# Patient Record
Sex: Male | Born: 1960 | Race: Black or African American | Hispanic: No | Marital: Married | Smoking: Former smoker
Health system: Southern US, Community
[De-identification: ages and names within clinical notes are randomized; demographics above are authoritative.]

## PROBLEM LIST (undated history)

## (undated) ENCOUNTER — Emergency Department: Discharge status: Absent without leave | Payer: Self-pay

## (undated) DIAGNOSIS — E785 Hyperlipidemia, unspecified: Secondary | ICD-10-CM

## (undated) DIAGNOSIS — I10 Essential (primary) hypertension: Secondary | ICD-10-CM

## (undated) DIAGNOSIS — K219 Gastro-esophageal reflux disease without esophagitis: Secondary | ICD-10-CM

## (undated) HISTORY — DX: Gastro-esophageal reflux disease without esophagitis: K21.9

## (undated) HISTORY — DX: Hyperlipidemia, unspecified: E78.5

## (undated) HISTORY — PX: REPLACEMENT TOTAL KNEE: SUR1224

## (undated) HISTORY — PX: VARICOCELECTOMY: SHX1084

## (undated) HISTORY — DX: Essential (primary) hypertension: I10

---

## 2001-06-14 ENCOUNTER — Emergency Department (HOSPITAL_COMMUNITY): Admission: EM | Admit: 2001-06-14 | Discharge: 2001-06-14 | Payer: Self-pay | Admitting: Emergency Medicine

## 2008-11-09 ENCOUNTER — Ambulatory Visit: Payer: Self-pay | Admitting: Internal Medicine

## 2008-11-09 DIAGNOSIS — K921 Melena: Secondary | ICD-10-CM

## 2008-11-09 DIAGNOSIS — E7800 Pure hypercholesterolemia, unspecified: Secondary | ICD-10-CM

## 2008-11-09 DIAGNOSIS — F528 Other sexual dysfunction not due to a substance or known physiological condition: Secondary | ICD-10-CM

## 2008-11-09 LAB — CONVERTED CEMR LAB
Alkaline Phosphatase: 76 units/L (ref 39–117)
Bilirubin, Direct: 0.1 mg/dL (ref 0.0–0.3)
Calcium: 9.6 mg/dL (ref 8.4–10.5)
Cholesterol: 270 mg/dL (ref 0–200)
Crystals: NEGATIVE
Eosinophils Absolute: 0 10*3/uL (ref 0.0–0.7)
Eosinophils Relative: 0.6 % (ref 0.0–5.0)
GFR calc Af Amer: 83 mL/min
GFR calc non Af Amer: 69 mL/min
Glucose, Bld: 108 mg/dL — ABNORMAL HIGH (ref 70–99)
HCT: 39.4 % (ref 39.0–52.0)
HDL: 43 mg/dL (ref >39.0)
Hemoglobin, Urine: NEGATIVE
LDL Goal: 130 mg/dL
Leukocytes, UA: NEGATIVE
MCV: 83.3 fL (ref 78.0–100.0)
Monocytes Absolute: 0.4 10*3/uL (ref 0.1–1.0)
Neutrophils Relative %: 37.6 % — ABNORMAL LOW (ref 43.0–77.0)
Nitrite: NEGATIVE
PSA: 1.06 ng/mL (ref 0.10–4.00)
Platelets: 233 10*3/uL (ref 150–400)
Potassium: 3.2 meq/L — ABNORMAL LOW (ref 3.5–5.1)
RBC / HPF: NONE SEEN
RDW: 14.3 % (ref 11.5–14.6)
Sodium: 143 meq/L (ref 135–145)
Specific Gravity, Urine: >=1.03 (ref 1.000–1.03)
TSH: 1.69 microintl units/mL (ref 0.35–5.50)
Testosterone: 245.26 ng/dL — ABNORMAL LOW (ref 350.00–890)
Total Bilirubin: 0.6 mg/dL (ref 0.3–1.2)
Total CHOL/HDL Ratio: 6.3
Total Protein: 7 g/dL (ref 6.0–8.3)
Triglycerides: 327 mg/dL (ref 0–149)
Urine Glucose: NEGATIVE mg/dL
Urobilinogen, UA: 0.2 (ref 0.0–1.0)
WBC: 5.6 10*3/uL (ref 4.5–10.5)

## 2008-11-12 ENCOUNTER — Encounter: Payer: Self-pay | Admitting: Internal Medicine

## 2008-11-12 DIAGNOSIS — I10 Essential (primary) hypertension: Secondary | ICD-10-CM

## 2008-11-12 DIAGNOSIS — E785 Hyperlipidemia, unspecified: Secondary | ICD-10-CM

## 2008-12-06 ENCOUNTER — Telehealth (INDEPENDENT_AMBULATORY_CARE_PROVIDER_SITE_OTHER): Payer: Self-pay | Admitting: *Deleted

## 2008-12-12 ENCOUNTER — Telehealth: Payer: Self-pay | Admitting: Internal Medicine

## 2008-12-19 ENCOUNTER — Ambulatory Visit: Payer: Self-pay | Admitting: Internal Medicine

## 2008-12-19 DIAGNOSIS — R7303 Prediabetes: Secondary | ICD-10-CM

## 2008-12-19 DIAGNOSIS — E876 Hypokalemia: Secondary | ICD-10-CM

## 2008-12-19 LAB — CONVERTED CEMR LAB
BUN: 11 mg/dL (ref 6–23)
Chloride: 104 meq/L (ref 96–112)
Cortisol, Plasma: 9.5 ug/dL
GFR calc Af Amer: 116 mL/min
GFR calc non Af Amer: 96 mL/min
Hgb A1c MFr Bld: 5.9 % (ref 4.6–6.0)
Potassium: 3.5 meq/L (ref 3.5–5.1)

## 2008-12-20 ENCOUNTER — Encounter: Payer: Self-pay | Admitting: Internal Medicine

## 2009-02-01 ENCOUNTER — Telehealth: Payer: Self-pay | Admitting: Internal Medicine

## 2009-06-03 ENCOUNTER — Telehealth: Payer: Self-pay | Admitting: Internal Medicine

## 2009-06-21 ENCOUNTER — Telehealth: Payer: Self-pay | Admitting: Internal Medicine

## 2009-08-20 ENCOUNTER — Telehealth: Payer: Self-pay | Admitting: Internal Medicine

## 2009-09-06 ENCOUNTER — Ambulatory Visit: Payer: Self-pay | Admitting: Internal Medicine

## 2009-11-01 ENCOUNTER — Ambulatory Visit: Payer: Self-pay | Admitting: Internal Medicine

## 2009-11-01 ENCOUNTER — Telehealth: Payer: Self-pay | Admitting: Internal Medicine

## 2009-11-01 DIAGNOSIS — K219 Gastro-esophageal reflux disease without esophagitis: Secondary | ICD-10-CM

## 2009-11-04 ENCOUNTER — Encounter: Payer: Self-pay | Admitting: Internal Medicine

## 2009-11-29 ENCOUNTER — Ambulatory Visit: Payer: Self-pay | Admitting: Gastroenterology

## 2009-12-19 ENCOUNTER — Telehealth: Payer: Self-pay | Admitting: Internal Medicine

## 2010-01-01 ENCOUNTER — Telehealth: Payer: Self-pay | Admitting: Internal Medicine

## 2010-02-04 ENCOUNTER — Telehealth: Payer: Self-pay | Admitting: Internal Medicine

## 2010-04-24 ENCOUNTER — Ambulatory Visit: Payer: Self-pay | Admitting: Internal Medicine

## 2010-06-13 ENCOUNTER — Ambulatory Visit: Payer: Self-pay | Admitting: Internal Medicine

## 2010-06-13 DIAGNOSIS — N471 Phimosis: Secondary | ICD-10-CM

## 2010-06-13 DIAGNOSIS — G4730 Sleep apnea, unspecified: Secondary | ICD-10-CM | POA: Insufficient documentation

## 2010-06-13 DIAGNOSIS — N478 Other disorders of prepuce: Secondary | ICD-10-CM

## 2010-06-19 ENCOUNTER — Encounter (INDEPENDENT_AMBULATORY_CARE_PROVIDER_SITE_OTHER): Payer: Self-pay | Admitting: *Deleted

## 2010-07-03 ENCOUNTER — Telehealth: Payer: Self-pay | Admitting: Internal Medicine

## 2010-07-08 ENCOUNTER — Encounter: Payer: Self-pay | Admitting: Internal Medicine

## 2010-07-21 ENCOUNTER — Encounter (INDEPENDENT_AMBULATORY_CARE_PROVIDER_SITE_OTHER): Payer: Self-pay | Admitting: *Deleted

## 2010-08-14 ENCOUNTER — Telehealth: Payer: Self-pay | Admitting: Internal Medicine

## 2010-09-02 ENCOUNTER — Telehealth: Payer: Self-pay | Admitting: Internal Medicine

## 2010-09-05 ENCOUNTER — Ambulatory Visit: Payer: Self-pay | Admitting: Gastroenterology

## 2010-09-05 ENCOUNTER — Encounter (INDEPENDENT_AMBULATORY_CARE_PROVIDER_SITE_OTHER): Payer: Self-pay | Admitting: *Deleted

## 2010-09-05 DIAGNOSIS — K5900 Constipation, unspecified: Secondary | ICD-10-CM | POA: Insufficient documentation

## 2010-09-05 DIAGNOSIS — K625 Hemorrhage of anus and rectum: Secondary | ICD-10-CM

## 2010-09-05 LAB — CONVERTED CEMR LAB
Alkaline Phosphatase: 90 units/L (ref 39–117)
Basophils Absolute: 0 10*3/uL (ref 0.0–0.1)
Bilirubin, Direct: 0.1 mg/dL (ref 0.0–0.3)
Calcium: 9.9 mg/dL (ref 8.4–10.5)
Ferritin: 62.8 ng/mL (ref 22.0–322.0)
Folate: 8.1 ng/mL
GFR calc non Af Amer: 89.23 mL/min (ref >60)
HCT: 38.4 % — ABNORMAL LOW (ref 39.0–52.0)
Iron: 91 ug/dL (ref 42–165)
Lymphs Abs: 3.3 10*3/uL (ref 0.7–4.0)
MCV: 82.7 fL (ref 78.0–100.0)
Monocytes Absolute: 0.5 10*3/uL (ref 0.1–1.0)
Neutrophils Relative %: 42 % — ABNORMAL LOW (ref 43.0–77.0)
Platelets: 221 10*3/uL (ref 150.0–400.0)
Potassium: 4 meq/L (ref 3.5–5.1)
RDW: 16.6 % — ABNORMAL HIGH (ref 11.5–14.6)
Saturation Ratios: 25.9 % (ref 20.0–50.0)
Sodium: 142 meq/L (ref 135–145)
Total Bilirubin: 0.3 mg/dL (ref 0.3–1.2)
Transferrin: 251 mg/dL (ref 212.0–360.0)

## 2010-10-13 ENCOUNTER — Ambulatory Visit: Payer: Self-pay | Admitting: Gastroenterology

## 2010-10-21 ENCOUNTER — Telehealth: Payer: Self-pay | Admitting: Internal Medicine

## 2010-11-17 DIAGNOSIS — K222 Esophageal obstruction: Secondary | ICD-10-CM | POA: Insufficient documentation

## 2010-11-17 DIAGNOSIS — K449 Diaphragmatic hernia without obstruction or gangrene: Secondary | ICD-10-CM | POA: Insufficient documentation

## 2010-12-26 ENCOUNTER — Telehealth: Payer: Self-pay | Admitting: Internal Medicine

## 2011-01-15 NOTE — Progress Notes (Signed)
Summary: Samples  Phone Note Call from Patient Call back at [facility] Phone [phone]   Summary of Call: Patient is requesting samples of bp med.  Initial call taken by: Lamar Sprinkles, CMA,  August 14, 2010 10:19 AM  Follow-up for Phone Call        Left message on voicemail that samples are up front and ready for pick up. Follow-up by: Lucious Groves CMA,  August 14, 2010 3:41 PM

## 2011-01-15 NOTE — Miscellaneous (Signed)
Summary: Nexium Rx  Clinical Lists Changes  Medications: Added new medication of NEXIUM 40 MG  CPDR (ESOMEPRAZOLE MAGNESIUM) 1 capsule each day 30 minutes before meal - Signed Rx of NEXIUM 40 MG  CPDR (ESOMEPRAZOLE MAGNESIUM) 1 capsule each day 30 minutes before meal;  #30 x 11;  Signed;  Entered by: Doristine Church RN II;  Authorized by: Mardella Layman MD [facility];  Method used: Electronically to [facility] [address] [phone].*, [address] [place], [place], [place]  [address], Ph: [phone], Fax: [phone] Observations: Added new observation of ALLERGY REV: Done (10/13/2010 14:59) Added new observation of NKA: T (10/13/2010 14:59)    Prescriptions: NEXIUM 40 MG  CPDR (ESOMEPRAZOLE MAGNESIUM) 1 capsule each day 30 minutes before meal  #30 x 11   Entered by:   Doristine Church RN II   Authorized by:   Mardella Layman MD [facility]   Signed by:   Doristine Church RN II on 10/13/2010   Method used:   Electronically to        [facility] [address] [phone].* (retail)       [address]       [place], [place]  [address]       Ph: [phone]       Fax: [phone]   RxID:   [id number]

## 2011-01-15 NOTE — Progress Notes (Signed)
Summary: Azor samples  Phone Note Call from Patient Call back at [phone]   Caller: Patient Call For: Etta Grandchild MD Summary of Call: Pt requesting sample of his blood pressure medicine, pt states he was given sample prior. Please let pt know if he can get. Initial call taken by: Verdell Face,  December 19, 2009 9:32 AM  Follow-up for Phone Call        Patient was no show on 12/17. Is it ok to give Azor samples? Follow-up by: Lucious Groves,  December 19, 2009 11:55 AM  Additional Follow-up for Phone Call Additional follow up Details #1::        yes Additional Follow-up by: Etta Grandchild MD,  December 19, 2009 11:58 AM    Additional Follow-up for Phone Call Additional follow up Details #2::    Left message on voicemal notifying patient samples are ready for pick up. Follow-up by: Lucious Groves,  December 19, 2009 12:00 PM  Prescriptions: AZOR 5-20 MG TABS (AMLODIPINE-OLMESARTAN) once daily for high blood pressure  #35 x 0   Entered by:   Lucious Groves   Authorized by:   Etta Grandchild MD   Signed by:   Lucious Groves on 12/19/2009   Method used:   Samples Given   RxID:   [id number]

## 2011-01-15 NOTE — Progress Notes (Signed)
Summary: CALL  Phone Note Call from Patient Call back at Home Phone [phone]   Summary of Call: Patient is requesting a call back. left mess to call office back  Initial call taken by: Lamar Sprinkles, CMA,  September 02, 2010 3:00 PM  Follow-up for Phone Call        left mess to call office back.............Marland KitchenLamar Sprinkles, CMA  September 03, 2010 5:31 PM   Additional Follow-up for Phone Call Additional follow up Details #1::        Called pt again still no ansew LMOM RTC. Signing off note 3rd attempt Additional Follow-up by: Orlan Leavens RMA,  September 04, 2010 3:13 PM

## 2011-01-15 NOTE — Assessment & Plan Note (Signed)
Summary: fu--and sleep study referral--stc   Vital Signs:  Patient profile:   50 year old male Height:      66 inches (167.64 cm) Weight:      231 pounds (105.00 kg) BMI:     37.42 O2 Sat:      97 % on Room air Temp:     98.5 degrees F (36.94 degrees C) oral Pulse rate:   72 / minute Resp:     16 per minute BP sitting:   128 / 80  (left arm) Cuff size:   regular  Vitals Entered By: Lanier Prude, CMA(AAMA) (June 13, 2010 8:37 AM)  O2 Flow:  Room air CC: f/u. 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  History of Present Illness: F/up 1. He wants to see a Insurance underwriter for circumcision 2. He has heavy snoring and wife has witnessed apnea, also DOT says that he needs a sleep study 3. He wants to reschedule colonoscopy-he had a hard BM last week and noticed a small amount of blood    on toilet paper  Hypertension History:      He denies headache, chest pain, palpitations, dyspnea with exertion, orthopnea, PND, peripheral edema, visual symptoms, neurologic problems, syncope, and side effects from treatment.  He notes no problems with any antihypertensive medication side effects.        Positive major cardiovascular risk factors include male age 51 years old or older, hyperlipidemia, and hypertension.  Negative major cardiovascular risk factors include no history of diabetes, negative family history for ischemic heart disease, and non-tobacco-user status.        Further assessment for target organ damage reveals no history of ASHD, cardiac end-organ damage (CHF/LVH), stroke/TIA, peripheral vascular disease, renal insufficiency, or hypertensive retinopathy.     Preventive Screening-Counseling & Management  Alcohol-Tobacco     Alcohol drinks/day: 0     Smoking Status: quit     Year Quit: 1989     Pack years: 15     Passive Smoke  Exposure: no  Hep-HIV-STD-Contraception     HIV Risk: no      Sexual History:  currently monogamous.        Drug Use:  never.        Blood Transfusions:  no.    Medications Prior to Update: 1)  Fish Oil 2)  Azor 5-20 Mg Tabs (Amlodipine-Olmesartan) .... Once Daily For High Blood Pressure  Current Medications (verified): 1)  Fish Oil 2)  Azor 5-20 Mg Tabs (Amlodipine-Olmesartan) .... Once Daily For High Blood Pressure  Allergies (verified): No Known Drug Allergies  Past History:  Past Medical History: Last updated: 11/01/2009 Hyperlipidemia Hypertension GERD  Past Surgical History: Last updated: 11/09/2008 Varicocele removal  Family History: Last updated: 11/09/2008 Family History Diabetes 1st degree relative Family History High cholesterol Family History Hypertension  Social History: Last updated: 06/13/2010 Occupation: driver Married Alcohol use-no Drug use-no Regular exercise-no  Risk Factors: Alcohol Use: 0 (06/13/2010) Exercise: no (11/09/2008)  Risk Factors: Smoking Status: quit (06/13/2010) Passive Smoke Exposure: no (06/13/2010)  Family History: Reviewed history from 11/09/2008 and no changes required. Family History Diabetes 1st degree relative Family History High cholesterol Family History Hypertension  Social History: Reviewed history from 11/09/2008 and no changes required. Occupation: driver Married Alcohol use-no Drug use-no Regular exercise-no Sexual History:  currently monogamous Drug Use:  never Blood Transfusions:  no  Review of Systems       The patient complains of weight gain and hematochezia.  The patient denies anorexia, fever, chest pain, syncope, dyspnea on exertion, peripheral edema, prolonged cough, headaches, hemoptysis, abdominal pain, melena, severe indigestion/heartburn, suspicious skin lesions, difficulty walking, depression, and enlarged lymph nodes.    Physical Exam  General:  alert, well-developed,  well-nourished, well-hydrated, and overweight-appearing.   Head:  normocephalic and atraumatic.   Mouth:  Oral mucosa and oropharynx without lesions or exudates.  Teeth in good repair. Neck:  supple, full ROM, and no masses.   Lungs:  Normal respiratory effort, chest expands symmetrically. Lungs are clear to auscultation, no crackles or wheezes. Heart:  Normal rate and regular rhythm. S1 and S2 normal without gallop, murmur, click, rub or other extra sounds. Abdomen:  soft, non-tender, normal bowel sounds, no distention, no hepatomegaly, and no splenomegaly.   Genitalia:  uncircumcised, no hydrocele, no varicocele, no scrotal masses, no cutaneous lesions, no urethral discharge, R testes atrophic, and L testes atrophic.   Msk:  normal ROM, no joint tenderness, no joint swelling, no joint warmth, no redness over joints, no joint deformities, no joint instability, and no crepitation.   Extremities:  No clubbing, cyanosis, edema, or deformity noted with normal full range of motion of all joints.   Neurologic:  No cranial nerve deficits noted. Station and gait are normal. Plantar reflexes are down-going bilaterally. DTRs are symmetrical throughout. Sensory, motor and coordinative functions appear intact.   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 Once daily for high blood pressure  BP today: 128/80 Prior BP: 114/82 (11/01/2009)  Prior 10 Yr Risk Heart Disease: Not enough information (09/06/2009)  Labs  Reviewed: K+: 3.5 (12/19/2008) Creat: : 0.9 (12/19/2008)   Chol: 270 (11/09/2008)   HDL: 43.0 (11/09/2008)   LDL: DEL (11/09/2008)   TG: 327 (11/09/2008)  Complete Medication List: 1)  Fish Oil  2)  Azor 5-20 Mg Tabs (Amlodipine-olmesartan) .... Once daily for high blood pressure  Hypertension Assessment/Plan:      The patient's hypertensive risk group is category B: At least one risk factor (excluding diabetes) with no target organ damage.  Today's blood pressure is 128/80.  His blood pressure goal is < 140/90.  Patient Instructions: 1)  Please schedule a follow-up appointment in 2 months for a physical with fasting labs. 2)  It is important that you exercise regularly at least 20 minutes 5 times a week. If you develop chest pain, have severe difficulty breathing, or feel very tired , stop exercising immediately and seek medical attention. 3)  You need to lose weight. Consider a lower calorie diet and regular exercise.  4)  Check your Blood Pressure regularly. If it is above 140/90: you should make an appointment.

## 2011-01-15 NOTE — Progress Notes (Signed)
Summary: Needs info faxed??  Phone Note Call from Patient Call back at Home Phone [phone]   Summary of Call: Patient is requesting that we fax "proof of an apt" to where he is having his DOT physical. Fax [phone] Initial call taken by: Lamar Sprinkles, CMA,  July 03, 2010 10:32 AM  Follow-up for Phone Call        Patient is requesting that we fax confirmation of apt w/Pulmonary. Faxed referral form that pt has an apt tomorrow. Pt informed  Follow-up by: Lamar Sprinkles, CMA,  July 03, 2010 2:45 PM

## 2011-01-15 NOTE — Progress Notes (Signed)
Summary: bp meds  Phone Note Call from Patient Call back at Home Phone [phone]   Caller: Patient Call For: Etta Grandchild MD Summary of Call: Pt needs blood pressure meds. Initial call taken by: Verdell Face,  December 26, 2010 3:33 PM  Follow-up for Phone Call        left message for pt to callback office Follow-up by: Brenton Grills CMA Duncan Dull),  December 26, 2010 4:07 PM    Prescriptions: AZOR 5-20 MG TABS (AMLODIPINE-OLMESARTAN) once daily for high blood pressure  #30 x 6   Entered by:   Rock Nephew CMA   Authorized by:   Etta Grandchild MD   Signed by:   Rock Nephew CMA on 12/26/2010   Method used:   Electronically to        [facility] [address] [phone].* (retail)       [address]       [place], [place]  [address]       Ph: [phone]       Fax: [phone]   RxID:   [id number]   Appended Document: bp meds Pt informed

## 2011-01-15 NOTE — Progress Notes (Signed)
Summary: NEW REFERRAL  Phone Note Call from Patient Call back at [phone]   Summary of Call: Pt is not happy with [facility] and he would like referral to another podiatrist.  Initial call taken by: Lamar Sprinkles, CMA,  February 04, 2010 1:09 PM

## 2011-01-15 NOTE — Letter (Signed)
Summary: [facility] Instructions  [facility]  [address] [place], [place] [address]   Phone: [phone]  Fax: [phone]       Alexander Moses    01/24/1961    MRN: [id number]        Procedure Day /Date: 10/13/2010 Monday     Arrival Time: 1:00pm     Procedure Time: 2:00pm     Location of Procedure:                    X  [facility] (4th Floor)                        PREPARATION FOR COLONOSCOPY WITH MOVIPREP   Starting 5 days prior to your procedure 10/08/2010 do not eat nuts, seeds, popcorn, corn, beans, peas,  salads, or any raw vegetables.  Do not take any fiber supplements (e.g. Metamucil, Citrucel, and Benefiber).  THE DAY BEFORE YOUR PROCEDURE         DATE: 10/12/2010 DAY: Sunday  1.  Drink clear liquids the entire day-NO SOLID FOOD  2.  Do not drink anything colored red or purple.  Avoid juices with pulp.  No orange juice.  3.  Drink at least 64 oz. (8 glasses) of fluid/clear liquids during the day to prevent dehydration and help the prep work efficiently.  CLEAR LIQUIDS INCLUDE: Water Jello Ice Popsicles Tea (sugar ok, no milk/cream) Powdered fruit flavored drinks Coffee (sugar ok, no milk/cream) Gatorade Juice: apple, white grape, white cranberry  Lemonade Clear bullion, consomm, broth Carbonated beverages (any kind) Strained chicken noodle soup Hard Candy                             4.  In the morning, mix first dose of MoviPrep solution:    Empty 1 Pouch A and 1 Pouch B into the disposable container    Add lukewarm drinking water to the top line of the container. Mix to dissolve    Refrigerate (mixed solution should be used within 24 hrs)  5.  Begin drinking the prep at 5:00 p.m. The MoviPrep container is divided by 4 marks.   Every 15 minutes drink the solution down to the next mark (approximately 8 oz) until the full liter is complete.   6.  Follow completed prep with 16 oz of clear liquid of your choice (Nothing  red or purple).  Continue to drink clear liquids until bedtime.  7.  Before going to bed, mix second dose of MoviPrep solution:    Empty 1 Pouch A and 1 Pouch B into the disposable container    Add lukewarm drinking water to the top line of the container. Mix to dissolve    Refrigerate  THE DAY OF YOUR PROCEDURE      DATE: 10/13/2010 DAY: Monday  Beginning at 9:00am (5 hours before procedure):         1. Every 15 minutes, drink the solution down to the next mark (approx 8 oz) until the full liter is complete.  2. Follow completed prep with 16 oz. of clear liquid of your choice.    3. You may drink clear liquids until 12:00pm (2 HOURS BEFORE PROCEDURE).   MEDICATION INSTRUCTIONS  Unless otherwise instructed, you should take regular prescription medications with a small sip of water   as early as possible the morning of your procedure.  OTHER INSTRUCTIONS  You will need a responsible adult at least 51 years of age to accompany you and drive you home.   This person must remain in the waiting room during your procedure.  Wear loose fitting clothing that is easily removed.  Leave jewelry and other valuables at home.  However, you may wish to bring a book to read or  an iPod/MP3 player to listen to music as you wait for your procedure to start.  Remove all body piercing jewelry and leave at home.  Total time from sign-in until discharge is approximately 2-3 hours.  You should go home directly after your procedure and rest.  You can resume normal activities the  day after your procedure.  The day of your procedure you should not:   Drive   Make legal decisions   Operate machinery   Drink alcohol   Return to work  You will receive specific instructions about eating, activities and medications before you leave.    The above instructions have been reviewed and explained to me by   _______________________    I fully understand and can verbalize these  instructions _____________________________ Date _________

## 2011-01-15 NOTE — Progress Notes (Signed)
Summary: SAMPLES  Phone Note Call from Patient   Summary of Call: Patient is requesting samples of azor.  Initial call taken by: Lamar Sprinkles, CMA,  October 21, 2010 12:30 PM  Follow-up for Phone Call        left vm for patient that samples are ready Follow-up by: Lamar Sprinkles, CMA,  October 21, 2010 5:27 PM    Prescriptions: AZOR 5-20 MG TABS (AMLODIPINE-OLMESARTAN) once daily for high blood pressure  #70 x 0   Entered by:   Lamar Sprinkles, CMA   Authorized by:   Etta Grandchild MD   Signed by:   Lamar Sprinkles, CMA on 10/21/2010   Method used:   Samples Given   RxID:   [id number]

## 2011-01-15 NOTE — Letter (Signed)
Summary: New Patient letter  [facility]  [address] [place], [place] [address]   Phone: [phone]  Fax: [phone]       06/19/2010 MRN: [id number]  Alexander Moses [address] [place], [place]  [address]  Dear Mr. Gutter,  Welcome to the Gastroenterology Division at [facility].    You are scheduled to see Dr.  Jarold Motto on 07-23-10 at 9:30a.m. on the 3rd floor at [facility], [address]  We ask that you try to arrive at our office 15 minutes prior to your appointment time to allow for check-in.  We would like you to complete the enclosed self-administered evaluation form prior to your visit and bring it with you on the day of your appointment.  We will review it with you.  Also, please bring a complete list of all your medications or, if you prefer, bring the medication bottles and we will list them.  Please bring your insurance card so that we may make a copy of it.  If your insurance requires a referral to see a specialist, please bring your referral form from your primary care physician.  Co-payments are due at the time of your visit and may be paid by cash, check or credit card.     Your office visit will consist of a consult with your physician (includes a physical exam), any laboratory testing he/she may order, scheduling of any necessary diagnostic testing (e.g. x-ray, ultrasound, CT-scan), and scheduling of a procedure (e.g. Endoscopy, Colonoscopy) if required.  Please allow enough time on your schedule to allow for any/all of these possibilities.    If you cannot keep your appointment, please call [phone] to cancel or reschedule prior to your appointment date.  This allows [place] the opportunity to schedule an appointment for another patient in need of care.  If you do not cancel or reschedule by 5 p.m. the business day prior to your appointment date, you will be charged a $50.00 late cancellation/no-show fee.    Thank you for choosing  [facility] for your medical needs.  We appreciate the opportunity to care for you.  Please visit [place] at our website  to learn more about our practice.                     Sincerely,                                                             The Gastroenterology Division

## 2011-01-15 NOTE — Procedures (Signed)
Summary: Upper Endoscopy  Patient: Alexander Moses Note: All result statuses are Final unless otherwise noted.  Tests: (1) Upper Endoscopy (EGD)   EGD Upper Endoscopy       DONE (C)     [facility]     [address]     [place], [place]  [address]           ENDOSCOPY PROCEDURE REPORT           PATIENT:  Alexander, Moses  MR#:  [id number]     BIRTHDATE:  May 02, 1961, 49 yrs. old  GENDER:  male           ENDOSCOPIST:  Vania Rea. Jarold Motto, MD, [facility]     Referred by:  Etta Grandchild, M.D.           PROCEDURE DATE:  10/13/2010     PROCEDURE:  EGD, diagnostic 43235     ASA CLASS:  Class I     INDICATIONS:  GERD           MEDICATIONS:   There was residual sedation effect present from     prior procedure., Fentanyl 37.5 mcg IV, Versed 3 mg IV     TOPICAL ANESTHETIC:           DESCRIPTION OF PROCEDURE:   After the risks benefits and     alternatives of the procedure were thoroughly explained, informed     consent was obtained.  The LB GIF-H180 [id number] endoscope was     introduced through the mouth and advanced to the second portion of     the duodenum, limited by retching and gagging.   The instrument     was slowly withdrawn as the mucosa was fully examined.     <<PROCEDUREIMAGES>>           A hiatal hernia was found. LARGE PROLAPSING HH AND "CAMERON     EROSIONS" IN HERNIA SAC AREA.  A stricture was found. EARLY GE     JUNCTION STRICTURE NOTED.SEE PICTURES.  The stomach was entered     and closely examined. The antrum, angularis, and lesser curvature     were well visualized, including a retroflexed view of the cardia     and fundus. The stomach wall was normally distensable. The scope     passed easily through the pylorus into the duodenum.     Retroflexed views revealed a hiatal hernia.    The scope was then     withdrawn from the patient and the procedure completed.           COMPLICATIONS:  None           ENDOSCOPIC IMPRESSION:     1) Hiatal hernia     2) Stricture   3) Normal stomach     LARGE HH AND GASTRIC EROSIONS.     RECOMMENDATIONS:     1) Anti-reflux regimen to be follow     NEXIUM 40 MG QAM.SEE ME 1 MONTH.MAY NEED FUNDOPLICATION SURGERY.                 REPEAT EXAM:  No           ______________________________     Vania Rea. Jarold Motto, MD, [facility]           CC:           n.     REVISED:  10/16/2010 03:27 PM     eSIGNED:   Vania Rea. Patterson at 10/16/2010 03:27  PM           [place], [place], [id number]  Note: An exclamation mark (!) indicates a result that was not dispersed into the flowsheet. Document Creation Date: 10/16/2010 3:27 PM _______________________________________________________________________  (1) Order result status: Final Collection or observation date-time: 10/13/2010 14:40 Requested date-time:  Receipt date-time:  Reported date-time:  Referring Physician:   Ordering Physician: Sheryn Bison [phone]) Specimen Source:  Source: Launa Grill Order Number: [id number] Lab site:   Appended Document: Upper Endoscopy    Clinical Lists Changes  Problems: Added new problem of HIATAL HERNIA (ICD-553.3) Added new problem of ESOPHAGEAL STRICTURE (ICD-530.3)

## 2011-01-15 NOTE — Letter (Signed)
Summary: New Patient letter  [facility]  [address] [place], [place] [address]   Phone: [phone]  Fax: [phone]       07/21/2010 MRN: [id number]  [facility] [address] [place], [place]  [address]  Dear Mr. Carmer,  Welcome to the Gastroenterology Division at [facility].    You are scheduled to see Dr.  Jarold Motto on 09/05/2010 at 9:30am on the 3rd floor at [facility], [address]  We ask that you try to arrive at our office 15 minutes prior to your appointment time to allow for check-in.  We would like you to complete the enclosed self-administered evaluation form prior to your visit and bring it with you on the day of your appointment.  We will review it with you.  Also, please bring a complete list of all your medications or, if you prefer, bring the medication bottles and we will list them.  Please bring your insurance card so that we may make a copy of it.  If your insurance requires a referral to see a specialist, please bring your referral form from your primary care physician.  Co-payments are due at the time of your visit and may be paid by cash, check or credit card.     Your office visit will consist of a consult with your physician (includes a physical exam), any laboratory testing he/she may order, scheduling of any necessary diagnostic testing (e.g. x-ray, ultrasound, CT-scan), and scheduling of a procedure (e.g. Endoscopy, Colonoscopy) if required.  Please allow enough time on your schedule to allow for any/all of these possibilities.    If you cannot keep your appointment, please call [phone] to cancel or reschedule prior to your appointment date.  This allows [place] the opportunity to schedule an appointment for another patient in need of care.  If you do not cancel or reschedule by 5 p.m. the business day prior to your appointment date, you will be charged a $50.00 late cancellation/no-show fee.    Thank you for choosing  [facility] for your medical needs.  We appreciate the opportunity to care for you.  Please visit [place] at our website  to learn more about our practice.                     Sincerely,                                                             The Gastroenterology Division

## 2011-01-15 NOTE — Progress Notes (Signed)
Summary: Referral to Foot MD  Phone Note Call from Patient Call back at [phone]   Summary of Call: Patient left message on triage that he needs a referral to a foot doctor. I called patient to find out what the problem is and per the patient he has had an increasing amount of trouble with his feet being sore, and on his great toe the nail is growing unevenly (?ingrown). Initial call taken by: Alexander Moses,  January 01, 2010 9:12 AM  Follow-up for Phone Call        done Follow-up by: Etta Grandchild MD,  January 01, 2010 9:23 AM  Additional Follow-up for Phone Call Additional follow up Details #1::        thanks. Additional Follow-up by: Alexander Moses,  January 01, 2010 10:39 AM  New Problems: INGROWING NAIL (ICD-703.0)   New Problems: INGROWING NAIL (ICD-703.0)

## 2011-01-15 NOTE — Assessment & Plan Note (Signed)
Summary: blood in stool--ch   History of Present Illness Visit Type: Initial Consult Primary GI MD: Sheryn Bison MD FACP FAGA Primary Provider: Etta Grandchild MD Requesting Provider: Sanda Linger, MD Chief Complaint: BRB in stool after straing during BM's. Pt states he feels external hemorrhoids and occasionally with pain, burning. Pt states he needs to taking more fiber in his diet. History of Present Illness:   50 year old American male referred for evaluation of 2 years of rectal bleeding associated with hard bowel movements and chronic constipation. He is status post what sounds like a pyloroplasty for pyloric stenosis when he was a young child. He has chronic acid reflux symptoms with intermittent solid food dysphagia. His bowels are constipated chronically and if he strains she will see some fresh blood. He has not had previous endoscopic exam for barium studies.  He denies difficulty intolerance, history of hepatitis or pancreatitis. His appetite is good and his weight is stable. He does not abuse alcohol, cigarettes, or NSAIDs.   GI Review of Systems    Reports acid reflux.      Denies abdominal pain, belching, bloating, chest pain, dysphagia with liquids, dysphagia with solids, heartburn, loss of appetite, nausea, vomiting, vomiting blood, weight loss, and  weight gain.      Reports constipation, hemorrhoids, rectal bleeding, and  rectal pain.     Denies anal fissure, black tarry stools, diarrhea, diverticulosis, fecal incontinence, heme positive stool, irritable bowel syndrome, jaundice, light color stool, and  liver problems.    Current Medications (verified): 1)  Fish Oil 2)  Azor 5-20 Mg Tabs (Amlodipine-Olmesartan) .... Once Daily For High Blood Pressure  Allergies (verified): No Known Drug Allergies  Past History:  Past medical, surgical, family and social histories (including risk factors) reviewed for relevance to current acute and chronic problems.  Past  Medical History: Reviewed history from 11/01/2009 and no changes required. Hyperlipidemia Hypertension GERD  Past Surgical History: Reviewed history from 11/09/2008 and no changes required. Varicocele removal  Family History: Reviewed history from 11/09/2008 and no changes required. Family History Diabetes 1st degree relative Family History High cholesterol Family History Hypertension  Social History: Reviewed history from 06/13/2010 and no changes required. Occupation: driver Married Alcohol use-no Drug use-no Regular exercise-no  Review of Systems  The patient denies allergy/sinus, anemia, anxiety-new, arthritis/joint pain, back pain, blood in urine, breast changes/lumps, change in vision, confusion, cough, coughing up blood, depression-new, fainting, fatigue, fever, headaches-new, hearing problems, heart murmur, heart rhythm changes, itching, menstrual pain, muscle pains/cramps, night sweats, nosebleeds, pregnancy symptoms, shortness of breath, skin rash, sleeping problems, sore throat, swelling of feet/legs, swollen lymph glands, thirst - excessive , urination - excessive , urination changes/pain, urine leakage, vision changes, and voice change.    Vital Signs:  Patient profile:   50 year old male Height:      66 inches Weight:      236.25 pounds Pulse rate:   70 / minute Pulse rhythm:   regular BP sitting:   132 / 76  (right arm) Cuff size:   regular  Vitals Entered By: Christie Nottingham CMA Duncan Dull) (September 05, 2010 9:36 AM)  Physical Exam  General:  Well developed, well nourished, no acute distress.healthy appearing.   Head:  Normocephalic and atraumatic. Eyes:  PERRLA, no icterus.exam deferred to patient's ophthalmologist.   Neck:  Supple; no masses or thyromegaly. Lungs:  Clear throughout to auscultation. Heart:  Regular rate and rhythm; no murmurs, rubs,  or bruits. Abdomen:  Soft, nontender and nondistended.  No masses, hepatosplenomegaly or hernias noted.  Normal bowel sounds.well healed transverse midline abdominal scar. Bowel sounds are normal. Small inactive anterior hemorrhoid noted. Rectal:  small noninflamed anterior hemorrhoid noted. There no fissures or fistulae. I cannot appreciate rectal masses or tenderness and stool was guaiac-negative. Prostate:  .normal size prostate.   Msk:  Symmetrical with no gross deformities. Normal posture. Extremities:  No clubbing, cyanosis, edema or deformities noted. Neurologic:  Alert and  oriented x4;  grossly normal neurologically. Cervical Nodes:  No significant cervical adenopathy. Psych:  Alert and cooperative. Normal mood and affect.   Impression & Recommendations:  Problem # 1:  RECTAL BLEEDING (ICD-569.3) Assessment Unchanged probable hemorrhoidal bleeding associated with chronic functional constipation. We have placed him on a high fiber diet with p.r.n. MiraLax at bedtime, will check labs and colonoscopy exam. Local analcare with p.r.n. sitz baths and cleaning and p.r.n. Analmantle cream use has been explained to the patient. Orders: TLB-CBC Platelet - w/Differential (85025-CBCD) TLB-BMP (Basic Metabolic Panel-BMET) (80048-METABOL) TLB-Hepatic/Liver Function Pnl (80076-HEPATIC) TLB-TSH (Thyroid Stimulating Hormone) (84443-TSH) TLB-B12, Serum-Total ONLY (25366-Y40) TLB-Ferritin (82728-FER) TLB-Folic Acid (Folate) (82746-FOL) TLB-IBC Pnl (Iron/FE;Transferrin) (83550-IBC) T-Sprue Panel (Celiac Disease Aby Eval) (83516x3/[address]) TLB-IgA (Immunoglobulin A) (82784-IGA) Colon/Endo (Colon/Endo)  Problem # 2:  GERD (ICD-530.81) Assessment: Deteriorated Reflux Regime Reviewed, start AcipHex 20 mg a day, and schedule endoscopic exam to exclude Barrett's mucosa. Orders: TLB-CBC Platelet - w/Differential (85025-CBCD) TLB-BMP (Basic Metabolic Panel-BMET) (80048-METABOL) TLB-Hepatic/Liver Function Pnl (80076-HEPATIC) TLB-TSH (Thyroid Stimulating Hormone) (84443-TSH) TLB-B12, Serum-Total ONLY  (34742-V95) TLB-Ferritin (82728-FER) TLB-Folic Acid (Folate) (82746-FOL) TLB-IBC Pnl (Iron/FE;Transferrin) (83550-IBC) T-Sprue Panel (Celiac Disease Aby Eval) (83516x3/[address]) TLB-IgA (Immunoglobulin A) (82784-IGA) Colon/Endo (Colon/Endo)  Problem # 3:  HYPERLIPIDEMIA (ICD-272.4) Assessment: Improved blood pressure today normal at 132/76 he is to continue his a Zocor 5 days 20 mg daily  Patient Instructions: 1)  Copy sent to : Etta Grandchild MD 2)  Your prescriptions have been sent to you pharmacy. 3)  Please go to the basement today for your labs.  4)  Your procedure has been scheduled for 10/13/2010, please follow the seperate instructions.  5)  [facility] Patient Information Guide given to patient.  6)  Colonoscopy and Flexible Sigmoidoscopy brochure given.  7)  The medication list was reviewed and reconciled.  All changed / newly prescribed medications were explained.  A complete medication list was provided to the patient / caregiver. Prescriptions: ANALPRAM-HC 1-2.5 % CREA (HYDROCORTISONE ACE-PRAMOXINE) apply to the rectum as needed  #1 tube x 1   Entered by:   Harlow Mares CMA (AAMA)   Authorized by:   Mardella Layman MD [facility]   Signed by:   Mardella Layman MD [facility] on 09/05/2010   Method used:   Electronically to        [facility] [address] [phone].* (retail)       [address]       [place], [place]  [address]       Ph: [phone]       Fax: [phone]   RxID:   [id number] MIRALAX  POWD (POLYETHYLENE GLYCOL 3350) mix one capfull in 8 ounces of water and drink at bedtime  #527 x 1   Entered by:   Harlow Mares CMA (AAMA)   Authorized by:   Mardella Layman MD [facility]   Signed by:   Mardella Layman MD [facility] on 09/05/2010   Method used:   Electronically to  [facility]  [place] #[id number].* (retail)       [address]       [place], [place]  [address]       Ph: [phone]       Fax:  [phone]   RxID:   [id number] NEXIUM 40 MG CPDR (ESOMEPRAZOLE MAGNESIUM) take one by mouth once daily  #30 x 1   Entered by:   Harlow Mares CMA (AAMA)   Authorized by:   Mardella Layman MD [facility]   Signed by:   Mardella Layman MD [facility] on 09/05/2010   Method used:   Electronically to        [facility] [address] [phone].* (retail)       [address]       [place], [place]  [address]       Ph: [phone]       Fax: [phone]   RxID:   [id number] MOVIPREP 100 GM  SOLR (PEG-KCL-NACL-NASULF-NA ASC-C) As per prep instructions.  #1 x 0   Entered by:   Harlow Mares CMA (AAMA)   Authorized by:   Mardella Layman MD [facility]   Signed by:   Mardella Layman MD [facility] on 09/05/2010   Method used:   Electronically to        [facility] [address] [phone].* (retail)       [address]       [place], [place]  [address]       Ph: [phone]       Fax: [phone]   RxID:   [id number]

## 2011-01-15 NOTE — Procedures (Signed)
Summary: Colonoscopy  Patient: [name] Note: All result statuses are Final unless otherwise noted.  Tests: (1) Colonoscopy (COL)   COL Colonoscopy           DONE     [facility]     [address]     [place], [place]  [address]           COLONOSCOPY PROCEDURE REPORT           PATIENT:  Alexander Moses, Alexander Moses  MR#:  [id number]     BIRTHDATE:  01-22-1961, 49 yrs. old  GENDER:  male     ENDOSCOPIST:  Vania Rea. Jarold Motto, MD, [facility]     REF. BY:  Etta Grandchild, M.D.     PROCEDURE DATE:  10/13/2010     PROCEDURE:  Average-risk screening colonoscopy     G0121     ASA CLASS:  Class I     INDICATIONS:  hematochezia     MEDICATIONS:   Fentanyl 37.5 mcg IV, Versed 4 mg IV           DESCRIPTION OF PROCEDURE:   After the risks benefits and     alternatives of the procedure were thoroughly explained, informed     consent was obtained.  Digital rectal exam was performed and     revealed no abnormalities.   The [id number] endoscope was     introduced through the anus and advanced to the cecum, which was     identified by both the appendix and ileocecal valve, without     limitations.  The quality of the prep was good, using MoviPrep.     The instrument was then slowly withdrawn as the colon was fully     examined.     <<PROCEDUREIMAGES>>           FINDINGS:  No polyps or cancers were seen.  This was otherwise a     normal examination of the colon.   Retroflexed views in the rectum     revealed no abnormalities.    The scope was then withdrawn from     the patient and the procedure completed.           COMPLICATIONS:  None     ENDOSCOPIC IMPRESSION:     1) No polyps or cancers     2) Otherwise normal examination     RECOMMENDATIONS:     1) Continue current colorectal screening recommendations for     "routine risk" patients with a repeat colonoscopy in 10 years.     REPEAT EXAM:  No           ______________________________     Vania Rea. Jarold Motto, MD, Clementeen Graham           CC:        n.     eSIGNED:   Vania Rea. Patterson at 10/13/2010 02:45 PM           Kenard Gower, [id number]  Note: An exclamation mark (!) indicates a result that was not dispersed into the flowsheet. Document Creation Date: 10/13/2010 2:45 PM _______________________________________________________________________  (1) Order result status: Final Collection or observation date-time: 10/13/2010 14:30 Requested date-time:  Receipt date-time:  Reported date-time:  Referring Physician:   Ordering Physician: Sheryn Bison [phone]) Specimen Source:  Source: Launa Grill Order Number: [id number] Lab site:   Appended Document: Colonoscopy    Clinical Lists Changes  Observations: Added new observation of COLONNXTDUE: 09/2020 (10/13/2010 15:59)

## 2011-01-15 NOTE — Letter (Signed)
Summary: No Show/[facility]  No Show/[facility]   Imported By: Sherian Rein 07/14/2010 08:45:28  _____________________________________________________________________  External Attachment:    Type:   Image     Comment:   External Document

## 2011-02-04 ENCOUNTER — Telehealth: Payer: Self-pay | Admitting: Internal Medicine

## 2011-02-06 ENCOUNTER — Ambulatory Visit (INDEPENDENT_AMBULATORY_CARE_PROVIDER_SITE_OTHER): Payer: PRIVATE HEALTH INSURANCE | Admitting: Internal Medicine

## 2011-02-06 ENCOUNTER — Encounter: Payer: Self-pay | Admitting: Internal Medicine

## 2011-02-06 DIAGNOSIS — I10 Essential (primary) hypertension: Secondary | ICD-10-CM

## 2011-02-06 DIAGNOSIS — F528 Other sexual dysfunction not due to a substance or known physiological condition: Secondary | ICD-10-CM

## 2011-02-10 NOTE — Progress Notes (Signed)
  Phone Note Call from Patient   Caller: Patient Summary of Call: Patient lmovm stating that BP medication is too expensive. He is requesting a call back to discuss any other options, can not afford to pay $100.00.Marland KitchenMarland KitchenAlvy Beal Archie CMA  February 04, 2011 11:05 AM   LMOVM for pt to pick up samples and  need to schedule a follow up/ over due.Alvy Beal Archie CMA  February 04, 2011 11:09 AM

## 2011-02-10 NOTE — Assessment & Plan Note (Signed)
Summary: f/u appt   Vital Signs:  Patient profile:   50 year old male Height:      66 inches (167.64 cm) Weight:      240.50 pounds (109.32 kg) BMI:     38.96 O2 Sat:      98 % on Room air Temp:     98.2 degrees F (36.78 degrees C) oral Pulse rate:   16 / minute Pulse rhythm:   regular Resp:     16 per minute BP sitting:   138 / 84  (left arm) Cuff size:   regular  Vitals Entered By: Burnard Leigh CMA(AAMA) (February 06, 2011 4:07 PM)  Nutrition Counseling: Patient's BMI is greater than 25 and therefore counseled on weight management options.  O2 Flow:  Room air CC: F/U Hypertension check and discuss meds & cost/sls,cma Is Patient Diabetic? No Pain Assessment Patient in pain? no      Comments Pt would like refill on Miralax Powder   Primary Care Provider:  Etta Grandchild MD  CC:  F/U Hypertension check and discuss meds & cost/sls and cma.  History of Present Illness:  Hypertension Follow-Up      This is a 50 year old man who presents for Hypertension follow-up.  The patient reports impotence, but denies lightheadedness, urinary frequency, headaches, edema, rash, and fatigue.  The patient denies the following associated symptoms: chest pain, chest pressure, exercise intolerance, dyspnea, palpitations, syncope, leg edema, and pedal edema.  Compliance with medications (by patient report) has been near 100%.  The patient reports that dietary compliance has been good.  The patient reports no exercise.  Adjunctive measures currently used by the patient include salt restriction and relaxation.    Preventive Screening-Counseling & Management  Alcohol-Tobacco     Alcohol drinks/day: 0     Alcohol Counseling: not indicated; patient does not drink     Smoking Status: quit     Year Quit: 1989     Pack years: 15     Passive Smoke Exposure: no  Hep-HIV-STD-Contraception     Hepatitis Risk: no risk noted     HIV Risk: no     HIV Risk Counseling: not indicated-no HIV risk  noted      Sexual History:  currently monogamous.        Drug Use:  never.        Blood Transfusions:  no.    Clinical Review Panels:  Prevention   Last Colonoscopy:  DONE (10/13/2010)   Last PSA:  1.06 (11/09/2008)  Lipid Management   Cholesterol:  270 (11/09/2008)   LDL (bad choesterol):  DEL (11/09/2008)   HDL (good cholesterol):  43.0 (11/09/2008)  Diabetes Management   HgBA1C:  5.9 (12/19/2008)   Creatinine:  1.1 (09/05/2010)  CBC   WBC:  6.6 (09/05/2010)   RBC:  4.64 (09/05/2010)   Hgb:  12.8 (09/05/2010)   Hct:  38.4 (09/05/2010)   Platelets:  221.0 (09/05/2010)   MCV  82.7 (09/05/2010)   MCHC  33.5 (09/05/2010)   RDW  16.6 (09/05/2010)   PMN:  42.0 (09/05/2010)   Lymphs:  49.7 (09/05/2010)   Monos:  7.1 (09/05/2010)   Eosinophils:  0.6 (09/05/2010)   Basophil:  0.6 (09/05/2010)  Complete Metabolic Panel   Glucose:  99 (09/05/2010)   Sodium:  142 (09/05/2010)   Potassium:  4.0 (09/05/2010)   Chloride:  105 (09/05/2010)   CO2:  29 (09/05/2010)   BUN:  11 (09/05/2010)   Creatinine:  1.1 (09/05/2010)  Albumin:  4.1 (09/05/2010)   Total Protein:  7.3 (09/05/2010)   Calcium:  9.9 (09/05/2010)   Total Bili:  0.3 (09/05/2010)   Alk Phos:  90 (09/05/2010)   SGPT (ALT):  28 (09/05/2010)   SGOT (AST):  23 (09/05/2010)   Medications Prior to Update: 1)  Fish Oil 2)  Azor 5-20 Mg Tabs (Amlodipine-Olmesartan) .... Once Daily For High Blood Pressure 3)  Nexium 40 Mg Cpdr (Esomeprazole Magnesium) .... Take One By Mouth Once Daily 4)  Miralax  Powd (Polyethylene Glycol 3350) .... Mix One Capfull in 8 Ounces of Water and Drink At Bedtime 5)  Analpram-Hc 1-2.5 % Crea (Hydrocortisone Ace-Pramoxine) .... Apply To The Rectum As Needed 6)  Nexium 40 Mg  Cpdr (Esomeprazole Magnesium) .Marland Kitchen.. 1 Capsule Each Day 30 Minutes Before Meal  Current Medications (verified): 1)  Fish Oil 1000mg  .... Once Daily 2)  Azor 5-20 Mg Tabs (Amlodipine-Olmesartan) .... Once Daily For High  Blood Pressure 3)  Nexium 40 Mg Cpdr (Esomeprazole Magnesium) .... Take One By Mouth Once Daily 4)  Miralax  Powd (Polyethylene Glycol 3350) .... Mix One Capfull in 8 Ounces of Water and Drink At Bedtime 5)  Analpram-Hc 1-2.5 % Crea (Hydrocortisone Ace-Pramoxine) .... Apply To The Rectum As Needed 6)  Nexium 40 Mg  Cpdr (Esomeprazole Magnesium) .Marland Kitchen.. 1 Capsule Each Day 30 Minutes Before Meal 7)  Cialis 20 Mg Tabs (Tadalafil) .... One By Mouth As Directed  Allergies (verified): No Known Drug Allergies  Past History:  Past Medical History: Last updated: 11/01/2009 Hyperlipidemia Hypertension GERD  Past Surgical History: Last updated: 11/09/2008 Varicocele removal  Family History: Last updated: 11/09/2008 Family History Diabetes 1st degree relative Family History High cholesterol Family History Hypertension  Social History: Last updated: 06/13/2010 Occupation: driver Married Alcohol use-no Drug use-no Regular exercise-no  Risk Factors: Alcohol Use: 0 (02/06/2011) Exercise: no (11/09/2008)  Risk Factors: Smoking Status: quit (02/06/2011) Passive Smoke Exposure: no (02/06/2011)  Family History: Reviewed history from 11/09/2008 and no changes required. Family History Diabetes 1st degree relative Family History High cholesterol Family History Hypertension  Social History: Reviewed history from 06/13/2010 and no changes required. Occupation: driver Married Alcohol use-no Drug use-no Regular exercise-no Hepatitis Risk:  no risk noted  Review of Systems  The patient denies anorexia, fever, weight loss, weight gain, chest pain, syncope, dyspnea on exertion, peripheral edema, prolonged cough, headaches, hemoptysis, abdominal pain, hematuria, suspicious skin lesions, transient blindness, difficulty walking, depression, unusual weight change, abnormal bleeding, and enlarged lymph nodes.   GU:  Complains of erectile dysfunction; denies decreased libido, discharge,  dysuria, genital sores, incontinence, nocturia, urinary frequency, and urinary hesitancy.  Physical Exam  General:  alert, well-developed, well-nourished, well-hydrated, and overweight-appearing.   Head:  normocephalic and atraumatic.   Mouth:  Oral mucosa and oropharynx without lesions or exudates.  Teeth in good repair. Neck:  supple, full ROM, and no masses.   Lungs:  Normal respiratory effort, chest expands symmetrically. Lungs are clear to auscultation, no crackles or wheezes. Heart:  Normal rate and regular rhythm. S1 and S2 normal without gallop, murmur, click, rub or other extra sounds. Abdomen:  soft, non-tender, normal bowel sounds, no distention, no hepatomegaly, and no splenomegaly.   Msk:  normal ROM, no joint tenderness, no joint swelling, no joint warmth, no redness over joints, no joint deformities, no joint instability, and no crepitation.   Pulses:  R and L carotid,radial,femoral,dorsalis pedis and posterior tibial pulses are full and equal bilaterally Extremities:  No clubbing, cyanosis, edema,  or deformity noted with normal full range of motion of all joints.   Neurologic:  No cranial nerve deficits noted. Station and gait are normal. Plantar reflexes are down-going bilaterally. DTRs are symmetrical throughout. Sensory, motor and coordinative functions appear intact. Skin:  turgor normal, color normal, no rashes, no suspicious lesions, and no ecchymoses.   Cervical Nodes:  no anterior cervical adenopathy and no posterior cervical adenopathy.   Psych:  Cognition and judgment appear intact. Alert and cooperative with normal attention span and concentration. No apparent delusions, illusions, hallucinations   Impression & Recommendations:  Problem # 1:  ERECTILE DYSFUNCTION (ICD-302.72) Assessment New  His updated medication list for this problem includes:    Cialis 20 Mg Tabs (Tadalafil) ..... One by mouth as directed  Discussed proper use of medications, as well as side  effects.   Problem # 2:  HYPERTENSION (ICD-401.9) Assessment: Unchanged  His updated medication list for this problem includes:    Azor 5-20 Mg Tabs (Amlodipine-olmesartan) ..... Once daily for high blood pressure  BP today: 138/84 Prior BP: 132/76 (09/05/2010)  Prior 10 Yr Risk Heart Disease: Not enough information (09/06/2009)  Labs Reviewed: K+: 4.0 (09/05/2010) Creat: : 1.1 (09/05/2010)   Chol: 270 (11/09/2008)   HDL: 43.0 (11/09/2008)   LDL: DEL (11/09/2008)   TG: 327 (11/09/2008)  Complete Medication List: 1)  Fish Oil 1000mg   .... Once daily 2)  Azor 5-20 Mg Tabs (Amlodipine-olmesartan) .... Once daily for high blood pressure 3)  Nexium 40 Mg Cpdr (Esomeprazole magnesium) .... Take one by mouth once daily 4)  Miralax Powd (Polyethylene glycol 3350) .... Mix one capfull in 8 ounces of water and drink at bedtime 5)  Analpram-hc 1-2.5 % Crea (Hydrocortisone ace-pramoxine) .... Apply to the rectum as needed 6)  Nexium 40 Mg Cpdr (Esomeprazole magnesium) .Marland Kitchen.. 1 capsule each day 30 minutes before meal 7)  Cialis 20 Mg Tabs (Tadalafil) .... One by mouth as directed  Patient Instructions: 1)  Please schedule a follow-up appointment in 4 months. 2)  It is important that you exercise regularly at least 20 minutes 5 times a week. If you develop chest pain, have severe difficulty breathing, or feel very tired , stop exercising immediately and seek medical attention. 3)  You need to lose weight. Consider a lower calorie diet and regular exercise.  4)  Check your Blood Pressure regularly. If it is above 130/80: you should make an appointment. Prescriptions: CIALIS 20 MG TABS (TADALAFIL) one by mouth as directed  #3 x 11   Entered and Authorized by:   Etta Grandchild MD   Signed by:   Etta Grandchild MD on 02/06/2011   Method used:   Print then Give to Patient   RxID:   [id number]    Orders Added: 1)  Est. Patient Level IV [14782]

## 2011-04-28 ENCOUNTER — Telehealth: Payer: Self-pay

## 2011-04-28 NOTE — Telephone Encounter (Signed)
Patient called [facility] requesting samples of BP medication Azor 5-20mg . Returned call and [facility] advising to pick up

## 2011-06-03 ENCOUNTER — Encounter: Payer: Self-pay | Admitting: Internal Medicine

## 2011-06-04 ENCOUNTER — Ambulatory Visit: Payer: PRIVATE HEALTH INSURANCE | Admitting: Internal Medicine

## 2011-06-08 ENCOUNTER — Ambulatory Visit: Payer: PRIVATE HEALTH INSURANCE | Admitting: Internal Medicine

## 2011-06-18 ENCOUNTER — Telehealth: Payer: Self-pay

## 2011-06-18 DIAGNOSIS — IMO0001 Reserved for inherently not codable concepts without codable children: Secondary | ICD-10-CM

## 2011-06-18 NOTE — Telephone Encounter (Signed)
Patient notified

## 2011-06-18 NOTE — Telephone Encounter (Signed)
Patient lm on triage requesting a referral to a "circumcision" MD. He prefers to see someone in [place] area. Thanks

## 2011-06-18 NOTE — Telephone Encounter (Signed)
done

## 2011-07-03 ENCOUNTER — Ambulatory Visit (INDEPENDENT_AMBULATORY_CARE_PROVIDER_SITE_OTHER)
Admission: RE | Admit: 2011-07-03 | Discharge: 2011-07-03 | Disposition: A | Discharge status: Home / Self Care | Payer: PRIVATE HEALTH INSURANCE | Source: Ambulatory Visit | Attending: Internal Medicine | Admitting: Internal Medicine

## 2011-07-03 ENCOUNTER — Ambulatory Visit (INDEPENDENT_AMBULATORY_CARE_PROVIDER_SITE_OTHER): Payer: PRIVATE HEALTH INSURANCE | Admitting: Internal Medicine

## 2011-07-03 ENCOUNTER — Other Ambulatory Visit (INDEPENDENT_AMBULATORY_CARE_PROVIDER_SITE_OTHER): Payer: PRIVATE HEALTH INSURANCE

## 2011-07-03 ENCOUNTER — Encounter: Payer: Self-pay | Admitting: Internal Medicine

## 2011-07-03 ENCOUNTER — Other Ambulatory Visit: Payer: Self-pay | Admitting: Internal Medicine

## 2011-07-03 VITALS — BP 120/82 | HR 72 | Temp 97.9°F | Resp 16 | Wt 243.0 lb

## 2011-07-03 DIAGNOSIS — R7309 Other abnormal glucose: Secondary | ICD-10-CM

## 2011-07-03 DIAGNOSIS — M25511 Pain in right shoulder: Secondary | ICD-10-CM

## 2011-07-03 DIAGNOSIS — F528 Other sexual dysfunction not due to a substance or known physiological condition: Secondary | ICD-10-CM

## 2011-07-03 DIAGNOSIS — E7800 Pure hypercholesterolemia, unspecified: Secondary | ICD-10-CM

## 2011-07-03 DIAGNOSIS — I10 Essential (primary) hypertension: Secondary | ICD-10-CM

## 2011-07-03 DIAGNOSIS — N410 Acute prostatitis: Secondary | ICD-10-CM

## 2011-07-03 DIAGNOSIS — E876 Hypokalemia: Secondary | ICD-10-CM

## 2011-07-03 DIAGNOSIS — M25519 Pain in unspecified shoulder: Secondary | ICD-10-CM

## 2011-07-03 DIAGNOSIS — Z0000 Encounter for general adult medical examination without abnormal findings: Secondary | ICD-10-CM | POA: Insufficient documentation

## 2011-07-03 DIAGNOSIS — Z23 Encounter for immunization: Secondary | ICD-10-CM

## 2011-07-03 LAB — CBC WITH DIFFERENTIAL/PLATELET
Basophils Absolute: 0 K/uL (ref 0.0–0.1)
Basophils Relative: 0.5 % (ref 0.0–3.0)
Eosinophils Absolute: 0 K/uL (ref 0.0–0.7)
Eosinophils Relative: 0.5 % (ref 0.0–5.0)
HCT: 38.5 % — ABNORMAL LOW (ref 39.0–52.0)
Hemoglobin: 12.7 g/dL — ABNORMAL LOW (ref 13.0–17.0)
Lymphocytes Relative: 48.4 % — ABNORMAL HIGH (ref 12.0–46.0)
Lymphs Abs: 3.2 K/uL (ref 0.7–4.0)
MCHC: 33.1 g/dL (ref 30.0–36.0)
MCV: 82 fl (ref 78.0–100.0)
Monocytes Absolute: 0.5 K/uL (ref 0.1–1.0)
Monocytes Relative: 8 % (ref 3.0–12.0)
Neutro Abs: 2.8 K/uL (ref 1.4–7.7)
Neutrophils Relative %: 42.6 % — ABNORMAL LOW (ref 43.0–77.0)
Platelets: 228 K/uL (ref 150.0–400.0)
RBC: 4.69 Mil/uL (ref 4.22–5.81)
RDW: 16.1 % — ABNORMAL HIGH (ref 11.5–14.6)
WBC: 6.6 K/uL (ref 4.5–10.5)

## 2011-07-03 LAB — LIPID PANEL
Cholesterol: 268 mg/dL — ABNORMAL HIGH (ref 0–200)
HDL: 51.5 mg/dL
Total CHOL/HDL Ratio: 5
Triglycerides: 232 mg/dL — ABNORMAL HIGH (ref 0.0–149.0)
VLDL: 46.4 mg/dL — ABNORMAL HIGH (ref 0.0–40.0)

## 2011-07-03 LAB — COMPREHENSIVE METABOLIC PANEL WITH GFR
ALT: 33 U/L (ref 0–53)
AST: 22 U/L (ref 0–37)
Albumin: 4.3 g/dL (ref 3.5–5.2)
Alkaline Phosphatase: 101 U/L (ref 39–117)
BUN: 13 mg/dL (ref 6–23)
CO2: 28 meq/L (ref 19–32)
Calcium: 9.5 mg/dL (ref 8.4–10.5)
Chloride: 107 meq/L (ref 96–112)
Creatinine, Ser: 1 mg/dL (ref 0.4–1.5)
GFR: 96.88 mL/min
Glucose, Bld: 97 mg/dL (ref 70–99)
Potassium: 4 meq/L (ref 3.5–5.1)
Sodium: 142 meq/L (ref 135–145)
Total Bilirubin: 0.3 mg/dL (ref 0.3–1.2)
Total Protein: 7.7 g/dL (ref 6.0–8.3)

## 2011-07-03 LAB — LDL CHOLESTEROL, DIRECT: Direct LDL: 201.7 mg/dL

## 2011-07-03 LAB — URINALYSIS, ROUTINE W REFLEX MICROSCOPIC
Ketones, ur: NEGATIVE
Specific Gravity, Urine: 1.02 (ref 1.000–1.030)
Total Protein, Urine: NEGATIVE
Urine Glucose: NEGATIVE
pH: 7 (ref 5.0–8.0)

## 2011-07-03 LAB — HEMOGLOBIN A1C: Hgb A1c MFr Bld: 6.4 % (ref 4.6–6.5)

## 2011-07-03 NOTE — Patient Instructions (Addendum)
Health Maintenance in Males MAINTAIN REGULAR HEALTH EXAMS  Maintain a healthy diet and normal weight. Increased weight leads to problems with blood pressure and diabetes. Decrease fat in the diet and increase exercise. Obtain a proper diet from your caregiver if necessary.   High blood pressure causes heart and blood vessel problems. Check blood pressures regularly and keep your blood pressure at normal limits. Aerobic exercise helps this. Persistent elevations of blood pressure should be treated with medications if weight loss and exercise are ineffective.   Avoid smoking, drinking in excess (more than 2 drinks per day), or use of street drugs. Do not share needles with anyone. Ask for help if you need assistance or instructions on stopping the use of alcohol, cigarettes, or drugs.   Maintain normal blood lipids and cholesterol. Your caregiver can give you information to lower your risk of heart disease or stroke.   Ask your caregiver if you are in need of early heart disease screening because of a strong family history of heart disease or signs of elevated testosterone (male sex hormone) levels. These can predispose you to early heart disease.   Practice safe sex. Practicing safe sex decreases your risk for a sexually transmitted infection (STI). Some of the STIs are gonorrhea, chlamydia, syphilis, trichimonas, herpes, human papillomavirus (HPV), and human immunodeficiency virus (HIV). Herpes, HIV, and HPV are viral illnesses that have no cure. These can result in disability, cancer, and death.   It is not safe for someone who has AIDS or is HIV positive to have unprotected sex with a partner who is HIV positive. The reason for this is the fact that there are many different strains of HIV. If you have a strain that is readily treated with medications and then suddenly introduce a strain from a partner that has no further treatment options, you may suddenly have a strain of HIV that is untreatable.  Even if you are both positive for HIV, it is still necessary to practice safe sex.   Use sunscreen with a SPF of 15 or greater. Being outside in the sun when your shadow caused by the sun is shorter than you are, means you are being exposed to sun at greater intensity. Lighter skinned people are at a greater risk of skin cancer.   Keep carbon monoxide and smoke detectors in your home and functioning at all times. Change the batteries every 6 months.   Do monthly examinations of your testicles. The best time to do this is after a hot shower or bath when the tissues are loose. Notify your caregivers of any lumps, tenderness, or changes in size or shape.   Notify your caregiver of new moles or changes in moles, especially if there is a change in shape or color. Also notify your caregiver if a mole is larger than the size of a pencil eraser.   Stay current with your tetanus shots and other required immunizations.  The Body Mass Index (BMI) is a way of measuring how much of your body is fat. Having a BMI above 27 increases the risk of heart disease, diabetes, hypertension, stroke, and other problems related to obesity. Document Released: 05/28/2008 Document Re-Released: 05/20/2010 [facility] Patient Information 2011 [place], [place].Rotator Cuff Injury The rotator cuff is the collective set of muscles and tendons that make up the stabilizing unit of your shoulder. This unit holds in the ball of the humerus (upper arm bone) in the socket of the scapula (shoulder blade). Injuries to this stabilizing unit most  commonly come from sports or activities that cause the arm to be moved repeatedly over the head. Examples of this include throwing, weight lifting, swimming, racquet sports, or an injury such as falling on your arm. Chronic (longstanding) irritation of this unit can cause inflammation (soreness), bursitis, and eventual damage to the tendons to the point of rupture (tear). An acute (sudden) injury of the  rotator cuff can result in a partial or complete tear. You may need surgery with complete tears. Small or partial rotator cuff tears may be treated conservatively with temporary immobilization, exercises and rest. Physical therapy may be needed. HOME CARE INSTRUCTIONS  Apply ice to the injury for 20 minutes 2 times per day for the first 2 days. Put the ice in a plastic bag and place a towel between the bag of ice and your skin.   If you have a shoulder immobilizer (sling and straps), do not remove it for   or until you see a caregiver for a follow-up examination. If you need to remove it, move your arm as little as possible.   You may want to sleep on several pillows or in a recliner at night to lessen swelling and pain.   Only take over-the-counter or prescription medicines for pain, discomfort, or fever as directed by your caregiver.   Do simple hand squeezing exercises with a soft rubber ball to decrease hand swelling.  SEEK MEDICAL CARE IF:  Pain in your shoulder increases or new pain or numbness develops in your arm, hand, or fingers.   Your hand or fingers are colder than your other hand.  SEEK IMMEDIATE MEDICAL CARE IF:  Your arm, hand, or fingers are numb or tingling.   Your arm, hand, or fingers are increasingly swollen and painful, or turn white or blue.  Document Released: 11/27/2000 Document Re-Released: 02/26/2009 [facility] Patient Information 2011 [place], [place].

## 2011-07-03 NOTE — Assessment & Plan Note (Signed)
I will check his A1C today 

## 2011-07-03 NOTE — Assessment & Plan Note (Signed)
FLP and TSH ordered

## 2011-07-03 NOTE — Assessment & Plan Note (Signed)
His exam is normal today, routine labs were ordered

## 2011-07-03 NOTE — Progress Notes (Signed)
Subjective:    Patient ID: Alexander Moses, male    DOB: 1961/01/11, 50 y.o.   MRN: [id number]  Hypertension This is a chronic problem. The current episode started more than 1 year ago. The problem has been gradually improving since onset. The problem is controlled. Pertinent negatives include no anxiety, blurred vision, chest pain, headaches, malaise/fatigue, neck pain, orthopnea, palpitations, peripheral edema, PND, shortness of breath or sweats. Past treatments include angiotensin blockers and calcium channel blockers. The current treatment provides significant improvement. Compliance problems include exercise and diet.  There is no history of chronic renal disease.  Hyperlipidemia This is a chronic problem. The current episode started more than 1 year ago. The problem is controlled. Recent lipid tests were reviewed and are variable. Exacerbating diseases include obesity. He has no history of chronic renal disease, diabetes, hypothyroidism, liver disease or nephrotic syndrome. Factors aggravating his hyperlipidemia include no known factors. Pertinent negatives include no chest pain, focal sensory loss, focal weakness, leg pain, myalgias or shortness of breath. Treatments tried: fish oils. The current treatment provides moderate improvement of lipids. Compliance problems include adherence to exercise and adherence to diet.   Shoulder Pain  The pain is present in the right shoulder. This is a chronic problem. The current episode started more than 1 year ago. There has been a history of trauma (10 years ago). The problem occurs intermittently. The problem has been gradually worsening. The quality of the pain is described as sharp. The pain is at a severity of 2/10. The pain is moderate. Pertinent negatives include no fever, inability to bear weight, itching, joint locking, joint swelling, limited range of motion, numbness, stiffness or tingling. The symptoms are aggravated by activity. He has tried NSAIDS for  the symptoms. The treatment provided moderate relief. Family history does not include gout or rheumatoid arthritis. There is no history of diabetes or osteoarthritis.      Review of Systems  Constitutional: Negative for fever, chills, malaise/fatigue, diaphoresis, activity change, appetite change, fatigue and unexpected weight change.  HENT: Negative for sore throat, facial swelling, trouble swallowing, neck pain and voice change.   Eyes: Negative for blurred vision, photophobia and visual disturbance.  Respiratory: Negative for apnea, cough, choking, chest tightness, shortness of breath, wheezing and stridor.   Cardiovascular: Negative for chest pain, palpitations, orthopnea, leg swelling and PND.  Gastrointestinal: Negative for nausea, vomiting, abdominal pain, diarrhea, constipation, blood in stool, abdominal distention and anal bleeding.  Genitourinary: Negative for dysuria, urgency, frequency, hematuria, flank pain, decreased urine volume, discharge, penile swelling, scrotal swelling, enuresis, difficulty urinating, genital sores, penile pain and testicular pain.  Musculoskeletal: Positive for arthralgias (right shoulder). Negative for myalgias, back pain, joint swelling, gait problem and stiffness.  Skin: Negative for itching.  Neurological: Negative for dizziness, tingling, tremors, focal weakness, seizures, syncope, facial asymmetry, speech difficulty, weakness, light-headedness, numbness and headaches.  Hematological: Negative for adenopathy. Does not bruise/bleed easily.  Psychiatric/Behavioral: Negative for suicidal ideas, hallucinations, behavioral problems, confusion, sleep disturbance, self-injury, dysphoric mood, decreased concentration and agitation. The patient is not nervous/anxious and is not hyperactive.        Objective:   Physical Exam  Vitals reviewed. Constitutional: He is oriented to person, place, and time. He appears well-developed and well-nourished. No distress.    HENT:  Head: Normocephalic and atraumatic.  Right Ear: External ear normal.  Left Ear: External ear normal.  Nose: Nose normal.  Mouth/Throat: Oropharynx is clear and moist. No oropharyngeal exudate.  Eyes: Conjunctivae and EOM are  normal. Pupils are equal, round, and reactive to light. Right eye exhibits no discharge. Left eye exhibits no discharge. No scleral icterus.  Neck: Normal range of motion. Neck supple. No JVD present. No tracheal deviation present. No thyromegaly present.  Cardiovascular: Normal rate, regular rhythm, normal heart sounds and intact distal pulses.  Exam reveals no gallop and no friction rub.   No murmur heard. Pulmonary/Chest: Effort normal and breath sounds normal. No stridor. No respiratory distress. He has no wheezes. He has no rales. He exhibits no tenderness.  Abdominal: Soft. Bowel sounds are normal. He exhibits no distension and no mass. There is no tenderness. There is no rebound and no guarding. Hernia confirmed negative in the right inguinal area and confirmed negative in the left inguinal area.  Genitourinary: Rectum normal, prostate normal, testes normal and penis normal. Rectal exam shows no external hemorrhoid, no internal hemorrhoid, no fissure, no mass, no tenderness and anal tone normal. Guaiac negative stool. Prostate is not enlarged and not tender. Right testis shows no mass, no swelling and no tenderness. Right testis is descended. Cremasteric reflex is not absent on the right side. Left testis shows no mass, no swelling and no tenderness. Cremasteric reflex is not absent on the left side. Uncircumcised. No phimosis, paraphimosis, hypospadias, penile erythema or penile tenderness. No discharge found.  Musculoskeletal: Normal range of motion. He exhibits no edema and no tenderness.  Lymphadenopathy:    He has no cervical adenopathy.       Right: No inguinal adenopathy present.       Left: No inguinal adenopathy present.  Neurological: He is alert and  oriented to person, place, and time. He has normal reflexes. He displays normal reflexes. No cranial nerve deficit. He exhibits normal muscle tone. Coordination normal.  Skin: Skin is warm and dry. No rash noted. He is not diaphoretic. No erythema. No pallor.  Psychiatric: He has a normal mood and affect. His behavior is normal. Judgment and thought content normal.        Lab Results  Component Value Date   WBC 6.6 09/05/2010   HGB 12.8* 09/05/2010   HCT 38.4* 09/05/2010   PLT 221.0 09/05/2010   CHOL 270* 11/09/2008   TRIG 327* 11/09/2008   HDL 43.0 11/09/2008   LDLDIRECT 145.7 11/09/2008   ALT 28 09/05/2010   AST 23 09/05/2010   NA 142 09/05/2010   K 4.0 09/05/2010   CL 105 09/05/2010   CREATININE 1.1 09/05/2010   BUN 11 09/05/2010   CO2 29 09/05/2010   TSH 1.45 09/05/2010   PSA 1.06 11/09/2008   HGBA1C 5.9 12/19/2008    Assessment & Plan:

## 2011-07-03 NOTE — Assessment & Plan Note (Signed)
I will recheck his K+ level today 

## 2011-07-03 NOTE — Assessment & Plan Note (Signed)
His BP is well controlled, I will check his lytes and renal function today 

## 2011-07-03 NOTE — Assessment & Plan Note (Signed)
I will check a plain film today to look for bone spurs or DJD, it sounds like he has a rotator cuff syndrome and may benefit from PT

## 2011-07-04 ENCOUNTER — Encounter: Payer: Self-pay | Admitting: Internal Medicine

## 2011-07-04 DIAGNOSIS — N410 Acute prostatitis: Secondary | ICD-10-CM | POA: Insufficient documentation

## 2011-07-04 MED ORDER — PITAVASTATIN CALCIUM 4 MG PO TABS: 1.0000 | ORAL_TABLET | Freq: Every day | ORAL | Status: DC

## 2011-07-04 MED ORDER — DOXYCYCLINE HYCLATE 100 MG PO TBEC: 100.0000 mg | DELAYED_RELEASE_TABLET | Freq: Two times a day (BID) | ORAL | Status: DC

## 2011-07-04 NOTE — Progress Notes (Signed)
Addended by: Etta Grandchild on: 07/04/2011 09:19 AM   Modules accepted: Orders

## 2011-07-06 ENCOUNTER — Telehealth: Payer: Self-pay

## 2011-07-06 DIAGNOSIS — N410 Acute prostatitis: Secondary | ICD-10-CM

## 2011-07-06 DIAGNOSIS — E7800 Pure hypercholesterolemia, unspecified: Secondary | ICD-10-CM

## 2011-07-06 MED ORDER — PITAVASTATIN CALCIUM 4 MG PO TABS: 1.0000 | ORAL_TABLET | Freq: Every day | ORAL | Status: DC

## 2011-07-06 MED ORDER — DOXYCYCLINE HYCLATE 100 MG PO TBEC: 100.0000 mg | DELAYED_RELEASE_TABLET | Freq: Two times a day (BID) | ORAL | Status: DC

## 2011-07-06 NOTE — Telephone Encounter (Signed)
Patient called stating that he received a messg from MD regarding lab results and new medication. He request rx sent to [facility]

## 2011-07-07 ENCOUNTER — Telehealth: Payer: Self-pay

## 2011-07-07 DIAGNOSIS — N410 Acute prostatitis: Secondary | ICD-10-CM

## 2011-07-07 DIAGNOSIS — E7800 Pure hypercholesterolemia, unspecified: Secondary | ICD-10-CM

## 2011-07-07 MED ORDER — DOXYCYCLINE HYCLATE 100 MG PO TABS: 100.0000 mg | ORAL_TABLET | Freq: Two times a day (BID) | ORAL | Status: AC

## 2011-07-07 MED ORDER — ATORVASTATIN CALCIUM 40 MG PO TABS: 40.0000 mg | ORAL_TABLET | Freq: Every day | ORAL | Status: DC

## 2011-07-07 NOTE — Telephone Encounter (Signed)
done

## 2011-07-07 NOTE — Telephone Encounter (Signed)
Patient notified

## 2011-07-07 NOTE — Telephone Encounter (Signed)
Patient called [facility] stating that he checked with pharmacy regarding cost of livalo and doxycycline and they are both too expensive. Per patient after insurance and discount savings, Livalo is $300 and doxycycline $200. He would like to know what MD suggest. Please advise Thanks

## 2011-07-22 ENCOUNTER — Ambulatory Visit: Payer: PRIVATE HEALTH INSURANCE | Admitting: Physical Therapy

## 2011-09-04 ENCOUNTER — Ambulatory Visit: Payer: PRIVATE HEALTH INSURANCE | Admitting: Internal Medicine

## 2011-09-07 ENCOUNTER — Ambulatory Visit: Payer: PRIVATE HEALTH INSURANCE | Admitting: Internal Medicine

## 2011-09-08 ENCOUNTER — Ambulatory Visit (INDEPENDENT_AMBULATORY_CARE_PROVIDER_SITE_OTHER): Payer: PRIVATE HEALTH INSURANCE | Admitting: Internal Medicine

## 2011-09-08 ENCOUNTER — Encounter: Payer: Self-pay | Admitting: Internal Medicine

## 2011-09-08 ENCOUNTER — Other Ambulatory Visit (INDEPENDENT_AMBULATORY_CARE_PROVIDER_SITE_OTHER): Payer: PRIVATE HEALTH INSURANCE

## 2011-09-08 DIAGNOSIS — I10 Essential (primary) hypertension: Secondary | ICD-10-CM

## 2011-09-08 DIAGNOSIS — E7800 Pure hypercholesterolemia, unspecified: Secondary | ICD-10-CM

## 2011-09-08 DIAGNOSIS — D649 Anemia, unspecified: Secondary | ICD-10-CM

## 2011-09-08 DIAGNOSIS — E876 Hypokalemia: Secondary | ICD-10-CM

## 2011-09-08 DIAGNOSIS — R7309 Other abnormal glucose: Secondary | ICD-10-CM

## 2011-09-08 LAB — IBC PANEL: Iron: 72 ug/dL (ref 42–165)

## 2011-09-08 LAB — COMPREHENSIVE METABOLIC PANEL
AST: 23 U/L (ref 0–37)
Albumin: 4 g/dL (ref 3.5–5.2)
BUN: 11 mg/dL (ref 6–23)
CO2: 29 mEq/L (ref 19–32)
Calcium: 9.7 mg/dL (ref 8.4–10.5)
Chloride: 109 mEq/L (ref 96–112)
GFR: 102.47 mL/min (ref >60.00)
Glucose, Bld: 94 mg/dL (ref 70–99)
Potassium: 4.1 mEq/L (ref 3.5–5.1)

## 2011-09-08 LAB — CBC WITH DIFFERENTIAL/PLATELET
Basophils Absolute: 0 10*3/uL (ref 0.0–0.1)
Eosinophils Absolute: 0 10*3/uL (ref 0.0–0.7)
HCT: 39.5 % (ref 39.0–52.0)
Lymphs Abs: 2.7 10*3/uL (ref 0.7–4.0)
MCHC: 32 g/dL (ref 30.0–36.0)
MCV: 83.7 fl (ref 78.0–100.0)
Monocytes Absolute: 0.5 10*3/uL (ref 0.1–1.0)
Neutrophils Relative %: 45.3 % (ref 43.0–77.0)
Platelets: 238 10*3/uL (ref 150.0–400.0)
RDW: 15.6 % — ABNORMAL HIGH (ref 11.5–14.6)

## 2011-09-08 LAB — VITAMIN B12: Vitamin B-12: 369 pg/mL (ref 211–911)

## 2011-09-08 LAB — LIPID PANEL
Cholesterol: 184 mg/dL (ref 0–200)
LDL Cholesterol: 112 mg/dL — ABNORMAL HIGH (ref 0–99)

## 2011-09-08 NOTE — Progress Notes (Signed)
Subjective:    Patient ID: Alexander Moses, male    DOB: 02-15-1961, 50 y.o.   MRN: [id number]  Anemia Presents for initial visit. The condition has lasted for 2 months. There has been no abdominal pain, anorexia, bruising/bleeding easily, confusion, fever, leg swelling, light-headedness, malaise/fatigue, pallor, palpitations, paresthesias, pica or weight loss. Signs of blood loss that are not present include hematemesis, hematochezia and melena. Past treatments include nothing. There is no history of chronic renal disease or hypothyroidism.  Hypertension This is a chronic problem. The current episode started more than 1 year ago. The problem has been gradually improving since onset. The problem is controlled. Pertinent negatives include no anxiety, blurred vision, chest pain, headaches, malaise/fatigue, neck pain, orthopnea, palpitations, peripheral edema, PND, shortness of breath or sweats. There are no associated agents to hypertension. Past treatments include angiotensin blockers and calcium channel blockers. The current treatment provides significant improvement. There are no compliance problems.  There is no history of chronic renal disease.  Hyperlipidemia This is a recurrent problem. The current episode started more than 1 year ago. The problem is resistant. Recent lipid tests were reviewed and are variable. Exacerbating diseases include obesity. He has no history of chronic renal disease, diabetes, hypothyroidism, liver disease or nephrotic syndrome. Factors aggravating his hyperlipidemia include no known factors. Pertinent negatives include no chest pain, focal sensory loss, focal weakness, leg pain, myalgias or shortness of breath. Current antihyperlipidemic treatment includes statins (omega 3 fish oils). The current treatment provides moderate improvement of lipids. There are no compliance problems.       Review of Systems  Constitutional: Negative for fever, chills, weight loss,  malaise/fatigue, diaphoresis, activity change, appetite change, fatigue and unexpected weight change.  HENT: Negative.  Negative for neck pain.   Eyes: Negative.  Negative for blurred vision.  Respiratory: Negative for apnea, cough, choking, chest tightness, shortness of breath and stridor.   Cardiovascular: Negative for chest pain, palpitations, orthopnea, leg swelling and PND.  Gastrointestinal: Negative for nausea, vomiting, abdominal pain, diarrhea, constipation, blood in stool, melena, hematochezia, abdominal distention, anal bleeding, anorexia and hematemesis.  Genitourinary: Negative for dysuria, urgency, frequency, hematuria, decreased urine volume, enuresis and difficulty urinating.  Musculoskeletal: Negative for myalgias, back pain, joint swelling, arthralgias and gait problem.  Skin: Negative for color change, pallor, rash and wound.  Neurological: Negative for dizziness, tremors, focal weakness, seizures, syncope, facial asymmetry, speech difficulty, weakness, light-headedness, numbness, headaches and paresthesias.  Hematological: Negative for adenopathy. Does not bruise/bleed easily.  Psychiatric/Behavioral: Negative for suicidal ideas, behavioral problems, confusion, sleep disturbance and agitation.       Objective:   Physical Exam  Vitals reviewed. Constitutional: He is oriented to person, place, and time. He appears well-developed and well-nourished. No distress.  HENT:  Mouth/Throat: Oropharynx is clear and moist. No oropharyngeal exudate.  Eyes: Conjunctivae are normal. Right eye exhibits no discharge. Left eye exhibits no discharge. No scleral icterus.  Neck: Normal range of motion. Neck supple. No JVD present. No tracheal deviation present. No thyromegaly present.  Cardiovascular: Normal rate, regular rhythm, normal heart sounds and intact distal pulses.  Exam reveals no gallop and no friction rub.   No murmur heard. Pulmonary/Chest: Effort normal and breath sounds  normal. No stridor. No respiratory distress. He has no wheezes. He has no rales. He exhibits no tenderness.  Abdominal: Soft. Bowel sounds are normal. He exhibits no distension and no mass. There is no tenderness. There is no rebound and no guarding.  Musculoskeletal: Normal range of  motion. He exhibits no edema and no tenderness.  Lymphadenopathy:    He has no cervical adenopathy.  Neurological: He is oriented to person, place, and time. He displays normal reflexes. He exhibits normal muscle tone. Coordination normal.  Skin: Skin is warm and dry. No rash noted. He is not diaphoretic. No erythema. No pallor.  Psychiatric: He has a normal mood and affect. His behavior is normal. Judgment and thought content normal.      Lab Results  Component Value Date   WBC 6.6 07/03/2011   HGB 12.7* 07/03/2011   HCT 38.5* 07/03/2011   PLT 228.0 07/03/2011   CHOL 268* 07/03/2011   TRIG 232.0* 07/03/2011   HDL 51.50 07/03/2011   LDLDIRECT 201.7 07/03/2011   ALT 33 07/03/2011   AST 22 07/03/2011   NA 142 07/03/2011   K 4.0 07/03/2011   CL 107 07/03/2011   CREATININE 1.0 07/03/2011   BUN 13 07/03/2011   CO2 28 07/03/2011   TSH 1.25 07/03/2011   PSA 1.11 07/03/2011   HGBA1C 6.4 07/03/2011      Assessment & Plan:

## 2011-09-08 NOTE — Patient Instructions (Signed)
Hypertriglyceridemia    Diet for High blood levels of Triglycerides  Most fats in food are triglycerides. Triglycerides in your blood are stored as fat in your body. High levels of triglycerides in your blood may put you at a greater risk for heart disease and stroke.    Normal triglyceride levels are less than 150 mg/dL. Borderline high levels are 150-199 mg/dl. High levels are 200 - 499 mg/dL, and very high triglyceride levels are greater than 500 mg/dL. The decision to treat high triglycerides is generally based on the level. For people with borderline or high triglyceride levels, treatment includes weight loss and exercise. Drugs are recommended for people with very high triglyceride levels.  Many people who need treatment for high triglyceride levels have metabolic syndrome. This syndrome is a collection of disorders that often include: insulin resistance, high blood pressure, blood clotting problems, high cholesterol and triglycerides.  TESTING PROCEDURE FOR TRIGLYCERIDES   You should not eat 4 hours before getting your triglycerides measured. The normal range of triglycerides is between 10 and 250 milligrams per deciliter (mg/dl). Some people may have extreme levels (1000 or above), but your triglyceride level may be too high if it is above 150 mg/dl, depending on what other risk factors you have for heart disease.    People with high blood triglycerides may also have high blood cholesterol levels. If you have high blood cholesterol as well as high blood triglycerides, your risk for heart disease is probably greater than if you only had high triglycerides. High blood cholesterol is one of the main risk factors for heart disease.   CHANGING YOUR DIET     Your weight can affect your blood triglyceride level. If you are more than 20% above your ideal body weight, you may be able to lower your blood triglycerides by losing weight. Eating less and exercising regularly is the best way to combat this. Fat provides more calories than any other food. The best way to lose weight is to eat less fat. Only 30% of your total calories should come from fat. Less than 7% of your diet should come from saturated fat. A diet low in fat and saturated fat is the same as a diet to decrease blood cholesterol. By eating a diet lower in fat, you may lose weight, lower your blood cholesterol, and lower your blood triglyceride level.    Eating a diet low in fat, especially saturated fat, may also help you lower your blood triglyceride level. Ask your dietitian to help you figure how much fat you can eat based on the number of calories your caregiver has prescribed for you.    Exercise, in addition to helping with weight loss may also help lower triglyceride levels.    Alcohol can increase blood triglycerides. You may need to stop drinking alcoholic beverages.    Too much carbohydrate in your diet may also increase your blood triglycerides. Some complex carbohydrates are necessary in your diet. These may include bread, rice, potatoes, other starchy vegetables and cereals.    Reduce "simple" carbohydrates. These may include pure sugars, candy, honey, and jelly without losing other nutrients. If you have the kind of high blood triglycerides that is affected by the amount of carbohydrates in your diet, you will need to eat less sugar and less high-sugar foods. Your caregiver can help you with this.    Adding 2-4 grams of fish oil (EPA+ DHA) may also help lower triglycerides. Speak with your caregiver before adding any supplements to   your regimen.   Following the Diet    Maintain your ideal weight. Your caregivers can help you with a diet. Generally, eating less food and getting more exercise will help you lose weight. Joining a weight control group may also help. Ask your caregivers for a good weight control group in your area.    Eat low-fat foods instead of high-fat foods. This can help you lose weight too.    These foods are lower in fat. Eat MORE of these:    Dried beans, peas, and lentils.      Egg whites.      Low-fat cottage cheese.      Fish.     Lean cuts of meat, such as round, sirloin, rump, and flank (cut extra fat off meat you fix).     Whole grain breads, cereals and pasta.      Skim and nonfat dry milk.      Low-fat yogurt.      Poultry without the skin.      Cheese made with skim or part-skim milk, such as mozzarella, parmesan, farmers', ricotta, or pot cheese.      These are higher fat foods. Eat LESS of these:    Whole milk and foods made from whole milk, such as American, blue, cheddar, monterey jack, and swiss cheese    High-fat meats, such as luncheon meats, sausages, knockwurst, bratwurst, hot dogs, ribs, corned beef, ground pork, and regular ground beef.    Fried foods.   Limit saturated fats in your diet. Substituting unsaturated fat for saturated fat may decrease your blood triglyceride level. You will need to read package labels to know which products contain saturated fats.    These foods are high in saturated fat. Eat LESS of these:    Fried pork skins.    Whole milk.      Skin and fat from poultry.      Palm oil.      Butter.     Shortening.     Cream cheese.      Bacon.     Margarines and baked goods made from listed oils.      Vegetable shortenings.     Chitterlings.    Fat from meats.      Coconut oil.      Palm kernel oil.      Lard.     Cream.     Sour cream.      Fatback.     Coffee whiteners and non-dairy creamers made with these oils.      Cheese made from whole milk.       Use unsaturated fats (both polyunsaturated and monounsaturated) moderately. Remember, even though unsaturated fats are better than saturated fats; you still want a diet low in total fat.    These foods are high in unsaturated fat:    Canola oil.    Sunflower oil.      Mayonnaise.     Almonds.     Peanuts.     Pine nuts.      Margarines made with these oils.     Safflower oil.    Olive oil.      Avocados.     Cashews.     Peanut butter.      Sunflower seeds.     Soybean oil.    Peanut oil.      Olives.     Pecans.     Walnuts.     Pumpkin seeds.        Avoid sugar and other high-sugar foods. This will decrease carbohydrates without decreasing other nutrients. Sugar in your food goes rapidly to your blood. When there is excess sugar in your blood, your liver may use it to make more triglycerides. Sugar also contains calories without other important nutrients.    Eat LESS of these:    Sugar, brown sugar, powdered sugar, jam, jelly, preserves, honey, syrup, molasses, pies, candy, cakes, cookies, frosting, pastries, colas, soft drinks, punches, fruit drinks, and regular gelatin.    Avoid alcohol. Alcohol, even more than sugar, may increase blood triglycerides. In addition, alcohol is high in calories and low in nutrients. Ask for sparkling water, or a diet soft drink instead of an alcoholic beverage.   Suggestions for planning and preparing meals    Bake, broil, grill or roast meats instead of frying.    Remove fat from meats and skin from poultry before cooking.    Add spices, herbs, lemon juice or vinegar to vegetables instead of salt, rich sauces or gravies.    Use a non-stick skillet without fat or use no-stick sprays.    Cool and refrigerate stews and broth. Then remove the hardened fat floating on the surface before serving.    Refrigerate meat drippings and skim off fat to make low-fat gravies.    Serve more fish.     Use less butter, margarine and other high-fat spreads on bread or vegetables.    Use skim or reconstituted non-fat dry milk for cooking.    Cook with low-fat cheeses.    Substitute low-fat yogurt or cottage cheese for all or part of the sour cream in recipes for sauces, dips or congealed salads.    Use half yogurt/half mayonnaise in salad recipes.    Substitute evaporated skim milk for cream. Evaporated skim milk or reconstituted non-fat dry milk can be whipped and substituted for whipped cream in certain recipes.    Choose fresh fruits for dessert instead of high-fat foods such as pies or cakes. Fruits are naturally low in fat.   When Dining Out    Order low-fat appetizers such as fruit or vegetable juice, pasta with vegetables or tomato sauce.    Select clear, rather than cream soups.    Ask that dressings and gravies be served on the side. Then use less of them.    Order foods that are baked, broiled, poached, steamed, stir-fried, or roasted.    Ask for margarine instead of butter, and use only a small amount.    Drink sparkling water, unsweetened tea or coffee, or diet soft drinks instead of alcohol or other sweet beverages.   QUESTIONS AND ANSWERS ABOUT OTHER FATS IN THE BLOOD:   SATURATED FAT, TRANS FAT, AND CHOLESTEROL  What is trans fat?  Trans fat is a type of fat that is formed when vegetable oil is hardened through a process called hydrogenation. This process helps makes foods more solid, gives them shape, and prolongs their shelf life. Trans fats are also called hydrogenated or partially hydrogenated oils.    What do saturated fat, trans fat, and cholesterol in foods have to do with heart disease?  Saturated fat, trans fat, and cholesterol in the diet all raise the level of LDL "bad" cholesterol in the blood. The higher the LDL cholesterol, the greater the risk for coronary heart disease (CHD). Saturated fat and trans fat raise LDL similarly.     What foods contain saturated fat, trans fat, and cholesterol?  High amounts of saturated fat   are found in animal products, such as fatty cuts of meat, chicken skin, and full-fat dairy products like butter, whole milk, cream, and cheese, and in tropical vegetable oils such as palm, palm kernel, and coconut oil. Trans fat is found in some of the same foods as saturated fat, such as vegetable shortening, some margarines (especially hard or stick margarine), crackers, cookies, baked goods, fried foods, salad dressings, and other processed foods made with partially hydrogenated vegetable oils. Small amounts of trans fat also occur naturally in some animal products, such as milk products, beef, and lamb. Foods high in cholesterol include liver, other organ meats, egg yolks, shrimp, and full-fat dairy products.  How can I use the new food label to make heart-healthy food choices?  Check the Nutrition Facts panel of the food label. Choose foods lower in saturated fat, trans fat, and cholesterol. For saturated fat and cholesterol, you can also use the Percent Daily Value (%DV): 5% DV or less is low, and 20% DV or more is high. (There is no %DV for trans fat.) Use the Nutrition Facts panel to choose foods low in saturated fat and cholesterol, and if the trans fat is not listed, read the ingredients and limit products that list shortening or hydrogenated or partially hydrogenated vegetable oil, which tend to be high in trans fat.  POINTS TO REMEMBER: YOU NEED A LITTLE TLC (THERAPEUTIC LIFESTYLE CHANGES)   Discuss your risk for heart disease with your caregivers, and take steps to reduce risk factors.    Change your diet. Choose foods that are low in saturated fat, trans fat, and cholesterol.     Add exercise to your daily routine if it is not already being done. Participate in physical activity of moderate intensity, like brisk walking, for at least 30 minutes on most, and preferably all days of the week. No time? Break the 30 minutes into three, 10-minute segments during the day.    Stop smoking. If you do smoke, contact your caregiver to discuss ways in which they can help you quit.    Do not use street drugs.    Maintain a normal weight.    Maintain a healthy blood pressure.    Keep up with your blood work for checking the fats in your blood as directed by your caregiver.   Document Released: 09/17/2004 Document Re-Released: 05/20/2010  [employer] Patient Information 2011 [employer].

## 2011-09-09 NOTE — Assessment & Plan Note (Signed)
His BP is well controlled 

## 2011-09-09 NOTE — Assessment & Plan Note (Signed)
Continue current regimen

## 2011-09-09 NOTE — Assessment & Plan Note (Signed)
I will check his A1C to see if this needs to be treated

## 2011-09-09 NOTE — Assessment & Plan Note (Signed)
I see no s/s of blood loss, I will check his CBC today and will look at his vitamin levels as well

## 2011-10-09 ENCOUNTER — Ambulatory Visit: Payer: PRIVATE HEALTH INSURANCE

## 2011-11-09 ENCOUNTER — Telehealth: Payer: Self-pay

## 2011-11-09 DIAGNOSIS — E7800 Pure hypercholesterolemia, unspecified: Secondary | ICD-10-CM

## 2011-11-09 MED ORDER — ATORVASTATIN CALCIUM 40 MG PO TABS: 40.0000 mg | ORAL_TABLET | Freq: Every day | ORAL | Status: DC

## 2011-11-09 NOTE — Telephone Encounter (Signed)
Per pt, told lipitor not generic by [facility], now request rx sent to [facility]

## 2011-12-06 IMAGING — CR DG SHOULDER 2+V*R*
4 series · 4 of 4 positions shown · non-contrast
Comparison: None

CLINICAL DATA: Right shoulder pain.

RIGHT SHOULDER - 2+ VIEW

[view not recorded (1 of 4)]
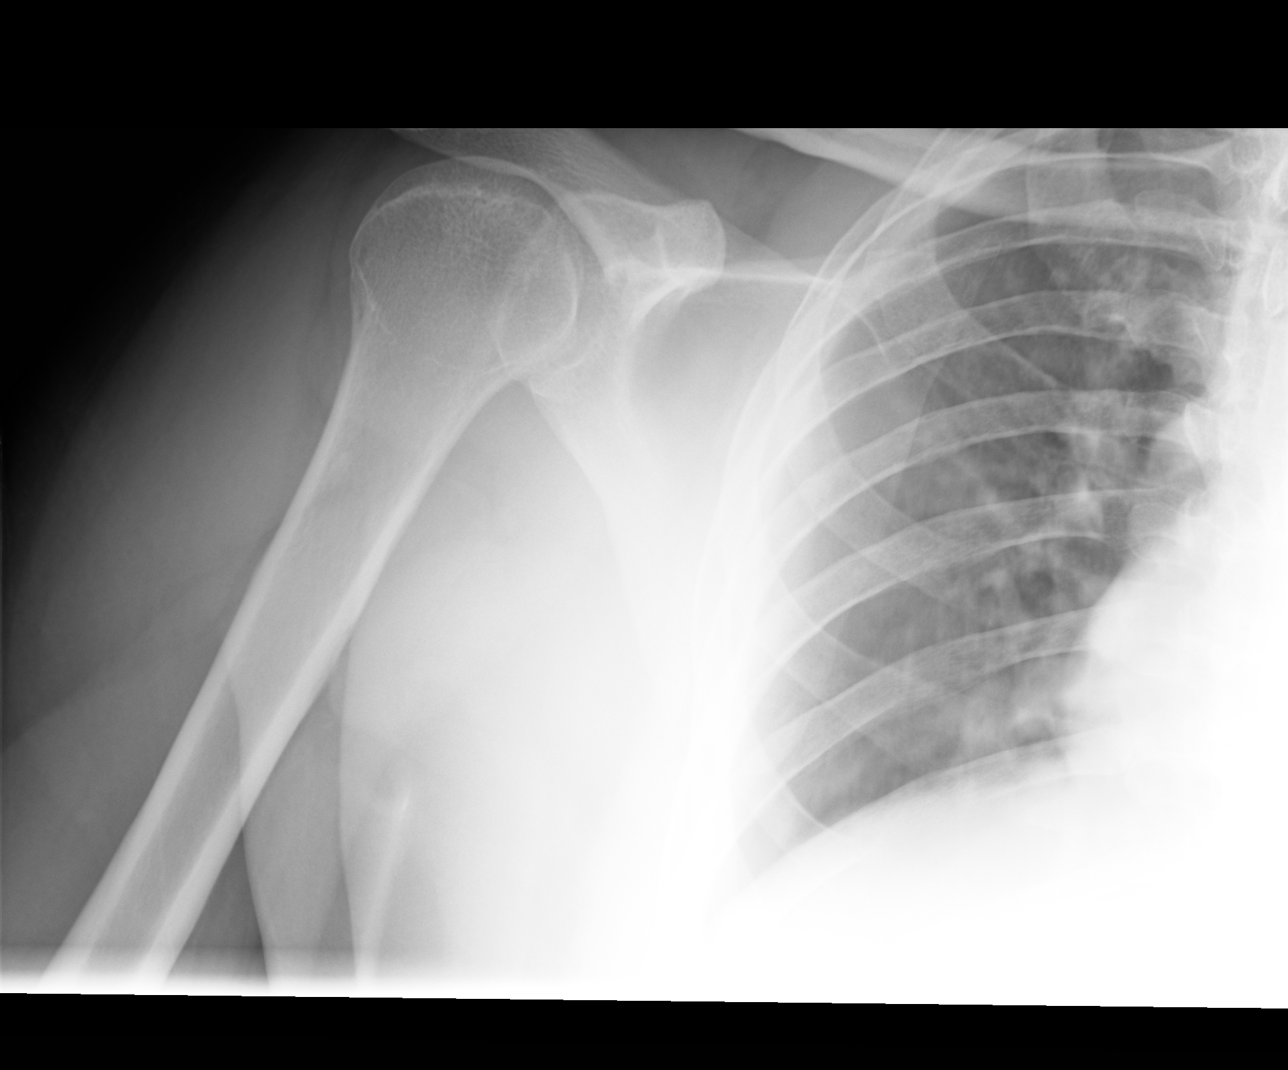

[view not recorded (2 of 4)]
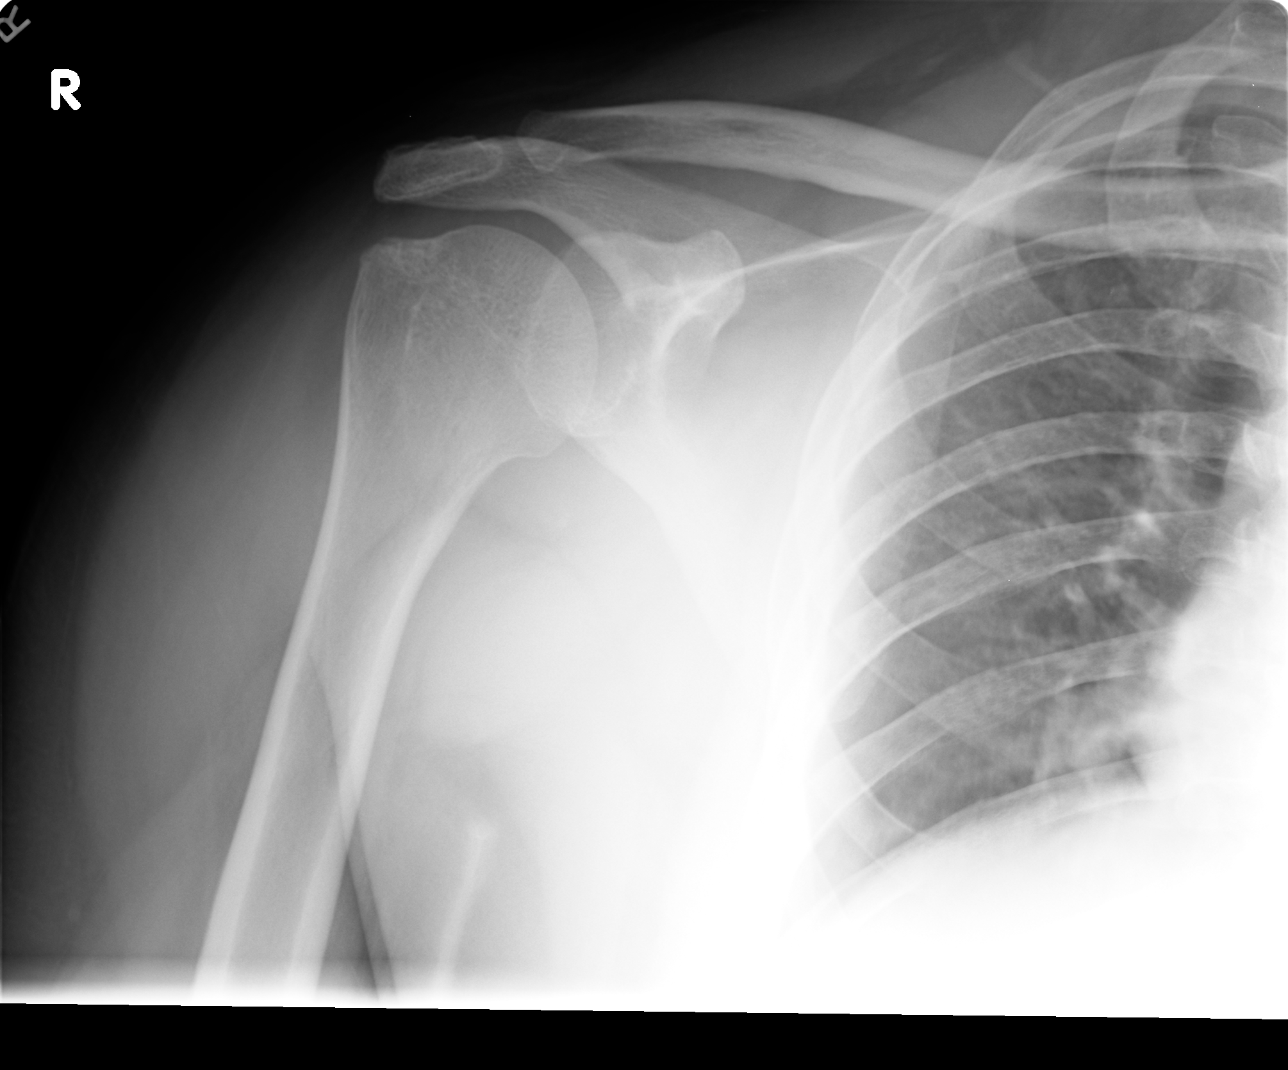

[view not recorded (3 of 4)]
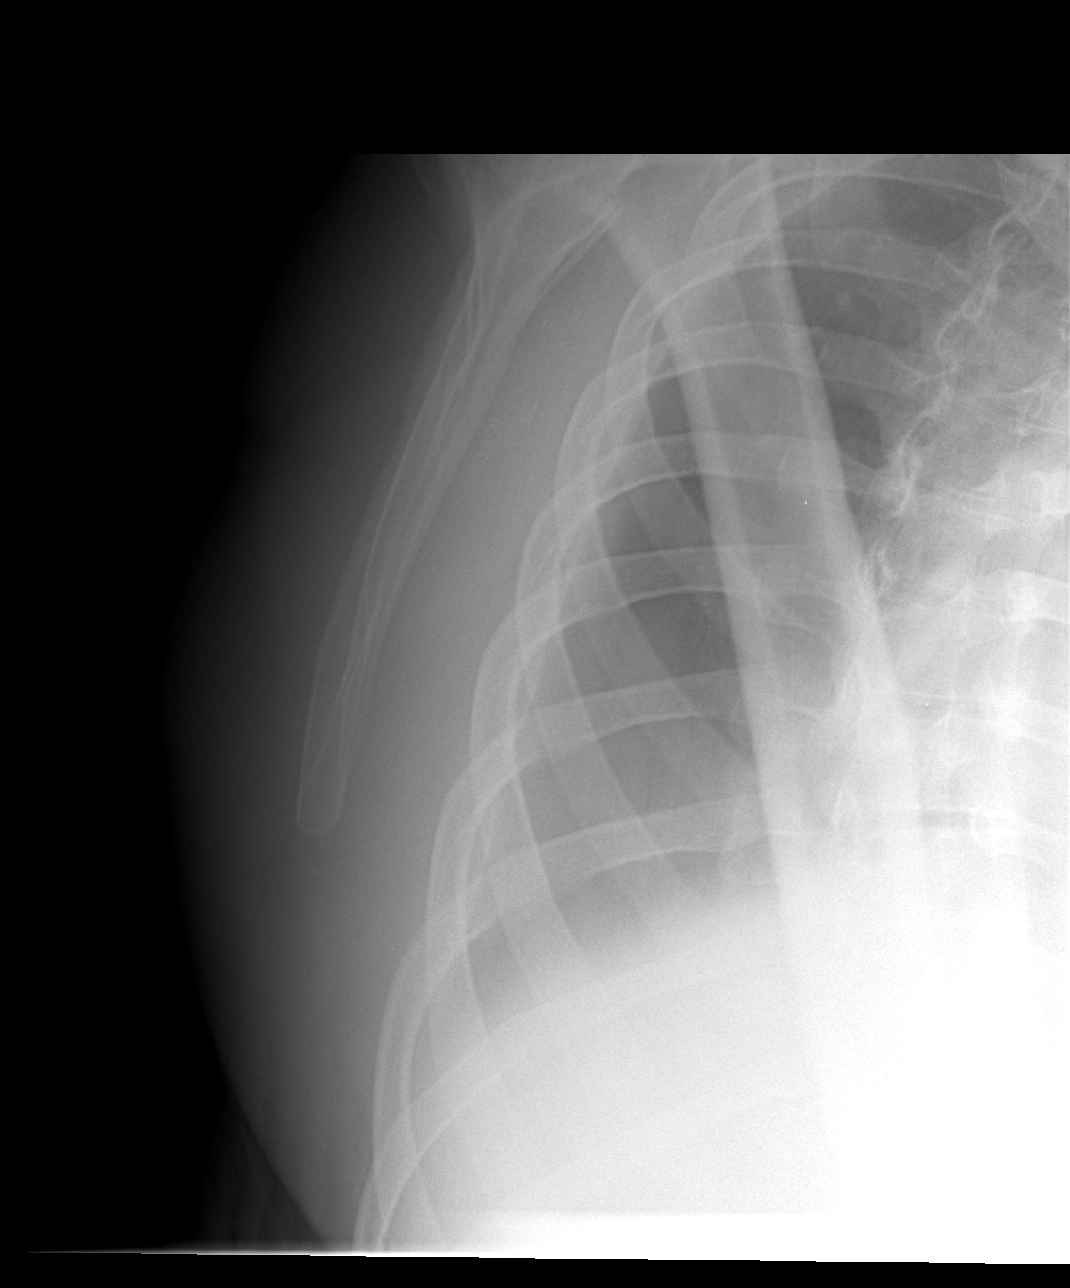

[view not recorded (4 of 4)]
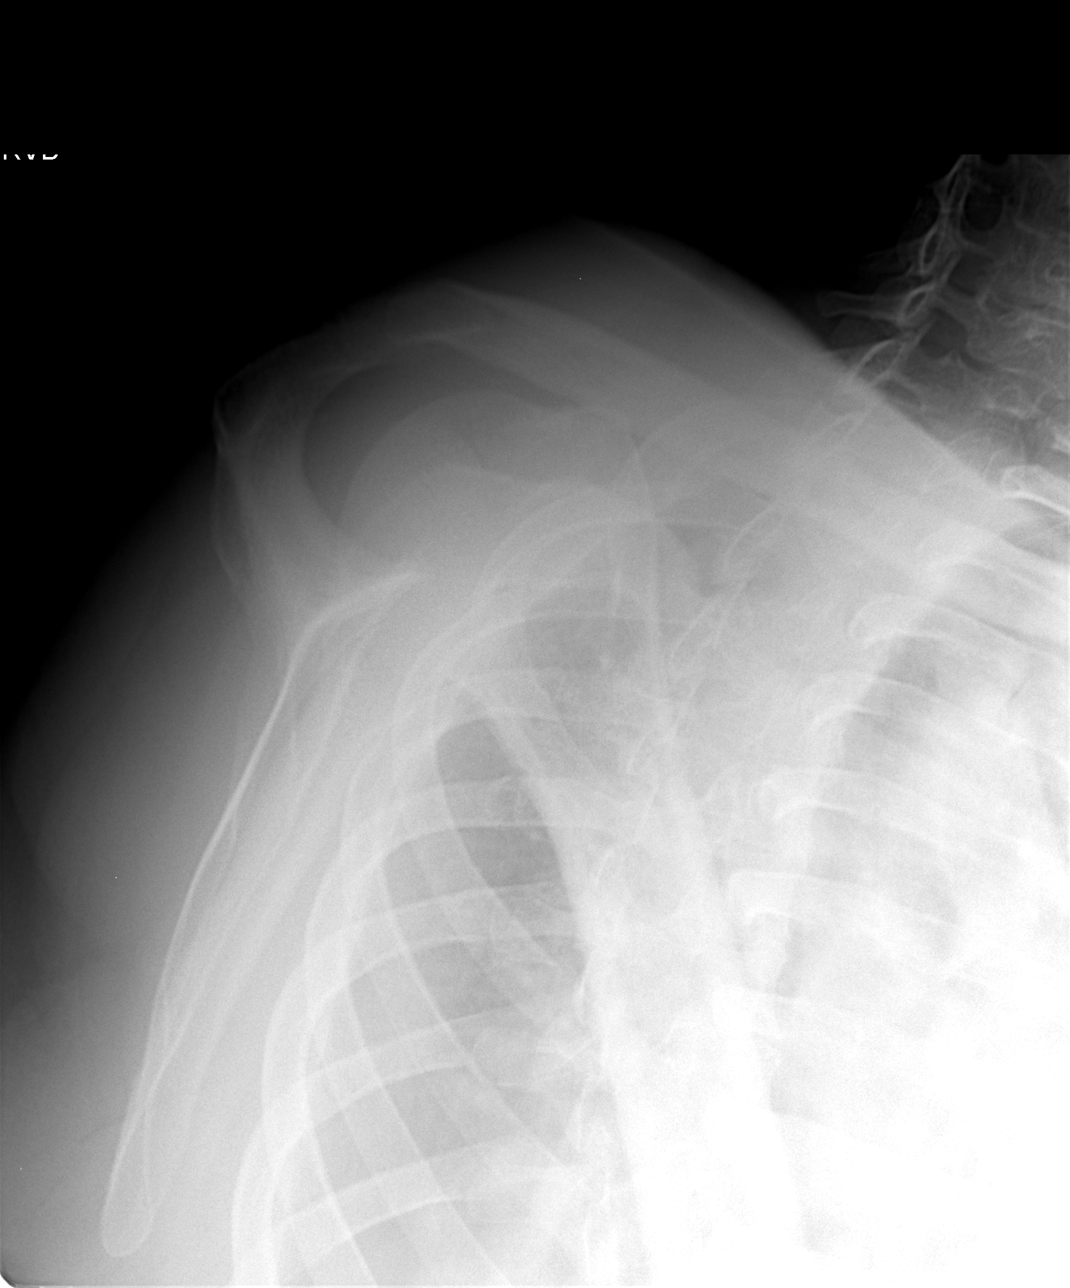

[4 of 4 positions shown; findings below may reference images not displayed]

FINDINGS: The joint spaces are maintained.  No acute bony findings.
No abnormal soft tissue calcifications.  The right lung apex is
clear.
IMPRESSION: No acute bony findings or significant degenerative changes.

## 2012-01-08 ENCOUNTER — Ambulatory Visit (INDEPENDENT_AMBULATORY_CARE_PROVIDER_SITE_OTHER): Payer: PRIVATE HEALTH INSURANCE | Admitting: Internal Medicine

## 2012-01-08 ENCOUNTER — Encounter: Payer: Self-pay | Admitting: Internal Medicine

## 2012-01-08 ENCOUNTER — Other Ambulatory Visit (INDEPENDENT_AMBULATORY_CARE_PROVIDER_SITE_OTHER): Payer: PRIVATE HEALTH INSURANCE

## 2012-01-08 DIAGNOSIS — I10 Essential (primary) hypertension: Secondary | ICD-10-CM

## 2012-01-08 DIAGNOSIS — D649 Anemia, unspecified: Secondary | ICD-10-CM

## 2012-01-08 DIAGNOSIS — E7800 Pure hypercholesterolemia, unspecified: Secondary | ICD-10-CM

## 2012-01-08 DIAGNOSIS — R7309 Other abnormal glucose: Secondary | ICD-10-CM

## 2012-01-08 LAB — CBC WITH DIFFERENTIAL/PLATELET
Basophils Absolute: 0 10*3/uL (ref 0.0–0.1)
Basophils Relative: 0.4 % (ref 0.0–3.0)
Eosinophils Absolute: 0 10*3/uL (ref 0.0–0.7)
Lymphocytes Relative: 50.5 % — ABNORMAL HIGH (ref 12.0–46.0)
MCHC: 32.6 g/dL (ref 30.0–36.0)
Neutrophils Relative %: 41.7 % — ABNORMAL LOW (ref 43.0–77.0)
RBC: 4.81 Mil/uL (ref 4.22–5.81)
RDW: 15.3 % — ABNORMAL HIGH (ref 11.5–14.6)

## 2012-01-08 LAB — COMPREHENSIVE METABOLIC PANEL
AST: 22 U/L (ref 0–37)
Albumin: 4.1 g/dL (ref 3.5–5.2)
Alkaline Phosphatase: 92 U/L (ref 39–117)
Potassium: 4.3 mEq/L (ref 3.5–5.1)
Sodium: 142 mEq/L (ref 135–145)
Total Protein: 7.5 g/dL (ref 6.0–8.3)

## 2012-01-08 LAB — LIPID PANEL
HDL: 47 mg/dL (ref >39.00)
VLDL: 25 mg/dL (ref 0.0–40.0)

## 2012-01-08 NOTE — Progress Notes (Signed)
Subjective:    Patient ID: Alexander Moses, male    DOB: 19-Jun-1961, 51 y.o.   MRN: [id number]  Hyperlipidemia This is a chronic problem. The current episode started more than 1 year ago. The problem is uncontrolled. Recent lipid tests were reviewed and are variable. Exacerbating diseases include diabetes and obesity. He has no history of chronic renal disease, liver disease or nephrotic syndrome. Factors aggravating his hyperlipidemia include fatty foods. Pertinent negatives include no chest pain, focal sensory loss, focal weakness, leg pain, myalgias or shortness of breath. Current antihyperlipidemic treatment includes statins. The current treatment provides mild improvement of lipids. Compliance problems include medication cost, adherence to exercise and adherence to diet.   Hypertension This is a chronic problem. The current episode started more than 1 year ago. The problem has been gradually improving since onset. The problem is controlled. Pertinent negatives include no anxiety, blurred vision, chest pain, headaches, malaise/fatigue, neck pain, orthopnea, palpitations, peripheral edema, PND, shortness of breath or sweats. Past treatments include angiotensin blockers and calcium channel blockers. The current treatment provides significant improvement. Compliance problems include exercise and diet.  There is no history of chronic renal disease.      Review of Systems  Constitutional: Negative for fever, chills, malaise/fatigue, diaphoresis, activity change, appetite change, fatigue and unexpected weight change.  HENT: Negative.  Negative for neck pain.   Eyes: Negative.  Negative for blurred vision.  Respiratory: Negative for apnea, cough, chest tightness, shortness of breath, wheezing and stridor.   Cardiovascular: Negative for chest pain, palpitations, orthopnea, leg swelling and PND.  Gastrointestinal: Negative for nausea, vomiting, abdominal pain, diarrhea, constipation, blood in stool and  abdominal distention.  Genitourinary: Negative.   Musculoskeletal: Negative for myalgias, back pain, joint swelling, arthralgias and gait problem.  Skin: Negative for color change, pallor, rash and wound.  Neurological: Negative for dizziness, tremors, focal weakness, seizures, syncope, facial asymmetry, speech difficulty, weakness, light-headedness, numbness and headaches.  Hematological: Negative for adenopathy. Does not bruise/bleed easily.  Psychiatric/Behavioral: Negative.        Objective:   Physical Exam  Vitals reviewed. Constitutional: He is oriented to person, place, and time. He appears well-developed and well-nourished. No distress.  HENT:  Head: Normocephalic and atraumatic.  Mouth/Throat: Oropharynx is clear and moist. No oropharyngeal exudate.  Eyes: Conjunctivae are normal. Right eye exhibits no discharge. Left eye exhibits no discharge. No scleral icterus.  Neck: Normal range of motion. Neck supple. No JVD present. No tracheal deviation present. No thyromegaly present.  Cardiovascular: Normal rate, regular rhythm, normal heart sounds and intact distal pulses.  Exam reveals no gallop and no friction rub.   No murmur heard. Pulmonary/Chest: Effort normal and breath sounds normal. No stridor. No respiratory distress. He has no wheezes. He has no rales.  Abdominal: Soft. Bowel sounds are normal. He exhibits no distension. There is no tenderness. There is no rebound and no guarding.  Musculoskeletal: Normal range of motion. He exhibits no edema and no tenderness.  Lymphadenopathy:    He has no cervical adenopathy.  Neurological: He is oriented to person, place, and time.  Skin: Skin is warm and dry. No rash noted. He is not diaphoretic. No erythema. No pallor.  Psychiatric: He has a normal mood and affect. His behavior is normal. Judgment and thought content normal.     Lab Results  Component Value Date   WBC 6.1 09/08/2011   HGB 12.6* 09/08/2011   HCT 39.5 09/08/2011    PLT 238.0 09/08/2011   GLUCOSE  94 09/08/2011   CHOL 184 09/08/2011   TRIG 128.0 09/08/2011   HDL 46.90 09/08/2011   LDLDIRECT 201.7 07/03/2011   LDLCALC 112* 09/08/2011   ALT 26 09/08/2011   AST 23 09/08/2011   NA 143 09/08/2011   K 4.1 09/08/2011   CL 109 09/08/2011   CREATININE 1.0 09/08/2011   BUN 11 09/08/2011   CO2 29 09/08/2011   TSH 1.25 07/03/2011   PSA 1.11 07/03/2011   HGBA1C 6.2 09/08/2011       Assessment & Plan:

## 2012-01-08 NOTE — Patient Instructions (Signed)

## 2012-01-09 ENCOUNTER — Encounter: Payer: Self-pay | Admitting: Internal Medicine

## 2012-01-09 NOTE — Assessment & Plan Note (Signed)
He is due for an a1c today 

## 2012-01-09 NOTE — Assessment & Plan Note (Signed)
His BP is well controlled, I will check his lytes and renal function 

## 2012-01-09 NOTE — Assessment & Plan Note (Signed)
He has not been taking lipitor b/c he can't afford the $25 co-pay, I will check his FLP today

## 2012-01-09 NOTE — Assessment & Plan Note (Signed)
He has no s/s of blood loss, I will check his CBC today 

## 2012-08-02 ENCOUNTER — Ambulatory Visit: Payer: PRIVATE HEALTH INSURANCE | Admitting: Internal Medicine

## 2012-08-05 ENCOUNTER — Ambulatory Visit (INDEPENDENT_AMBULATORY_CARE_PROVIDER_SITE_OTHER)
Admission: RE | Admit: 2012-08-05 | Discharge: 2012-08-05 | Disposition: A | Discharge status: Home / Self Care | Payer: PRIVATE HEALTH INSURANCE | Source: Ambulatory Visit | Attending: Internal Medicine | Admitting: Internal Medicine

## 2012-08-05 ENCOUNTER — Encounter: Payer: Self-pay | Admitting: Internal Medicine

## 2012-08-05 ENCOUNTER — Ambulatory Visit (INDEPENDENT_AMBULATORY_CARE_PROVIDER_SITE_OTHER): Payer: PRIVATE HEALTH INSURANCE | Admitting: Internal Medicine

## 2012-08-05 VITALS — BP 140/80 | HR 74 | Temp 98.2°F | Resp 16 | Wt 245.0 lb

## 2012-08-05 DIAGNOSIS — M5412 Radiculopathy, cervical region: Secondary | ICD-10-CM | POA: Insufficient documentation

## 2012-08-05 DIAGNOSIS — M542 Cervicalgia: Secondary | ICD-10-CM

## 2012-08-05 DIAGNOSIS — I10 Essential (primary) hypertension: Secondary | ICD-10-CM

## 2012-08-05 DIAGNOSIS — R937 Abnormal findings on diagnostic imaging of other parts of musculoskeletal system: Secondary | ICD-10-CM | POA: Insufficient documentation

## 2012-08-05 MED ORDER — METHOCARBAMOL 500 MG PO TABS: 500.0000 mg | ORAL_TABLET | Freq: Four times a day (QID) | ORAL | Status: AC

## 2012-08-05 MED ORDER — METHYLPREDNISOLONE ACETATE 80 MG/ML IJ SUSP
120.0000 mg | Freq: Once | INTRAMUSCULAR | Status: AC
  Administered 2012-08-05: 120 mg via INTRAMUSCULAR

## 2012-08-05 MED ORDER — AMLODIPINE-OLMESARTAN 5-20 MG PO TABS: 1.0000 | ORAL_TABLET | Freq: Every day | ORAL | Status: DC

## 2012-08-05 MED ORDER — OXYCODONE-ACETAMINOPHEN 7.5-325 MG PO TABS: 1.0000 | ORAL_TABLET | ORAL | Status: AC | PRN

## 2012-08-05 NOTE — Progress Notes (Signed)
Subjective:    Patient ID: Alexander Moses, male    DOB: 03-15-1961, 51 y.o.   MRN: [id number]  Neck Pain  This is a new problem. The current episode started yesterday. The problem occurs constantly. The problem has been unchanged. The pain is associated with nothing. The pain is present in the right side. The quality of the pain is described as stabbing and shooting. The pain is at a severity of 5/10. The pain is moderate. The symptoms are aggravated by twisting. The pain is same all the time. Stiffness is present all day. Associated symptoms include numbness (rt hand) and tingling (rt hand). Pertinent negatives include no chest pain, fever, headaches, leg pain, pain with swallowing, paresis, photophobia, syncope, trouble swallowing, visual change, weakness or weight loss. He has tried NSAIDs for the symptoms. The treatment provided mild relief.      Review of Systems  Constitutional: Negative.  Negative for fever and weight loss.  HENT: Positive for neck pain and neck stiffness. Negative for facial swelling, trouble swallowing, voice change and tinnitus.   Eyes: Negative.  Negative for photophobia.  Respiratory: Negative for cough, chest tightness, shortness of breath, wheezing and stridor.   Cardiovascular: Negative for chest pain, palpitations, leg swelling and syncope.  Gastrointestinal: Negative for nausea, vomiting, abdominal pain, diarrhea, constipation and blood in stool.  Genitourinary: Negative.   Musculoskeletal: Negative for myalgias, back pain, joint swelling, arthralgias and gait problem.  Skin: Negative for color change, pallor, rash and wound.  Neurological: Positive for tingling (rt hand) and numbness (rt hand). Negative for dizziness, tremors, seizures, syncope, facial asymmetry, speech difficulty, weakness, light-headedness and headaches.  Hematological: Negative for adenopathy. Does not bruise/bleed easily.  Psychiatric/Behavioral: Negative.        Objective:   Physical  Exam  Vitals reviewed. Constitutional: He is oriented to person, place, and time. He appears well-developed and well-nourished. No distress.  HENT:  Head: Normocephalic and atraumatic.  Mouth/Throat: Oropharynx is clear and moist. No oropharyngeal exudate.  Eyes: Conjunctivae are normal. Right eye exhibits no discharge. Left eye exhibits no discharge. No scleral icterus.  Neck: Normal range of motion. Neck supple. No JVD present. No tracheal deviation present. No thyromegaly present.  Cardiovascular: Normal rate, regular rhythm, normal heart sounds and intact distal pulses.  Exam reveals no gallop and no friction rub.   No murmur heard. Pulmonary/Chest: Effort normal and breath sounds normal. No stridor. No respiratory distress. He has no wheezes. He has no rales. He exhibits no tenderness.  Abdominal: Soft. Bowel sounds are normal. He exhibits no distension and no mass. There is no tenderness. There is no rebound and no guarding.  Musculoskeletal: Normal range of motion. He exhibits no edema and no tenderness.       Cervical back: Normal. He exhibits normal range of motion, no tenderness, no bony tenderness, no swelling, no edema, no deformity, no laceration, no pain, no spasm and normal pulse.  Lymphadenopathy:    He has no cervical adenopathy.  Neurological: He is alert and oriented to person, place, and time. He has normal strength. He displays no atrophy, no tremor and normal reflexes. No cranial nerve deficit or sensory deficit. He exhibits normal muscle tone. He displays a negative Romberg sign. He displays no seizure activity. Coordination and gait normal.  Reflex Scores:      Tricep reflexes are 1+ on the right side and 1+ on the left side.      Bicep reflexes are 1+ on the right side and  1+ on the left side.      Brachioradialis reflexes are 1+ on the right side and 1+ on the left side. Skin: Skin is warm and dry. No rash noted. He is not diaphoretic. No erythema. No pallor.    Psychiatric: He has a normal mood and affect. His behavior is normal. Judgment and thought content normal.      Lab Results  Component Value Date   WBC 6.0 01/08/2012   HGB 13.0 01/08/2012   HCT 40.0 01/08/2012   PLT 231.0 01/08/2012   GLUCOSE 90 01/08/2012   CHOL 236* 01/08/2012   TRIG 125.0 01/08/2012   HDL 47.00 01/08/2012   LDLDIRECT 161.4 01/08/2012   LDLCALC 112* 09/08/2011   ALT 30 01/08/2012   AST 22 01/08/2012   NA 142 01/08/2012   K 4.3 01/08/2012   CL 106 01/08/2012   CREATININE 1.1 01/08/2012   BUN 8 01/08/2012   CO2 29 01/08/2012   TSH 1.25 07/03/2011   PSA 1.11 07/03/2011   HGBA1C 6.0 01/08/2012      Assessment & Plan:

## 2012-08-05 NOTE — Patient Instructions (Signed)

## 2012-08-05 NOTE — Addendum Note (Signed)
Addended by: Etta Grandchild on: 08/05/2012 12:45 PM   Modules accepted: Orders

## 2012-08-05 NOTE — Assessment & Plan Note (Signed)
I will check a plain film today to see if he has DDD, bone spur, or another lesion affecting this area

## 2012-08-05 NOTE — Assessment & Plan Note (Signed)
His BP is not well controlled due to noncompliance, he will restart azor

## 2012-08-05 NOTE — Assessment & Plan Note (Signed)
He was given depo-medrol IM to relieve the pain, also will try percocet and robaxin, I will check plain films to look for a structural lesion

## 2012-08-08 ENCOUNTER — Telehealth: Payer: Self-pay

## 2012-08-08 DIAGNOSIS — M542 Cervicalgia: Secondary | ICD-10-CM

## 2012-08-08 DIAGNOSIS — M5412 Radiculopathy, cervical region: Secondary | ICD-10-CM

## 2012-08-08 MED ORDER — PREGABALIN 75 MG PO CAPS: 75.0000 mg | ORAL_CAPSULE | Freq: Two times a day (BID) | ORAL | Status: DC

## 2012-08-08 NOTE — Telephone Encounter (Signed)
Patient notified

## 2012-08-08 NOTE — Telephone Encounter (Signed)
Please call in lyrica for him

## 2012-08-08 NOTE — Telephone Encounter (Signed)
Pt called stating he is still experiencing neck and arm pain. Pt is requesting advisement from MD

## 2012-08-09 ENCOUNTER — Telehealth: Payer: Self-pay

## 2012-08-09 NOTE — Telephone Encounter (Signed)
Samples and co-pay card on your desk

## 2012-08-09 NOTE — Telephone Encounter (Signed)
Patient notified

## 2012-08-09 NOTE — Telephone Encounter (Signed)
Patient called stating that he cannot afford Lyrica $200. Patient request something cheaper that is also comparable to lyrica.

## 2012-08-10 ENCOUNTER — Encounter: Payer: Self-pay | Admitting: Internal Medicine

## 2012-08-10 ENCOUNTER — Ambulatory Visit (INDEPENDENT_AMBULATORY_CARE_PROVIDER_SITE_OTHER): Payer: PRIVATE HEALTH INSURANCE | Admitting: Internal Medicine

## 2012-08-10 VITALS — BP 130/80 | HR 79 | Temp 99.3°F | Resp 16 | Wt 247.0 lb

## 2012-08-10 DIAGNOSIS — R937 Abnormal findings on diagnostic imaging of other parts of musculoskeletal system: Secondary | ICD-10-CM

## 2012-08-10 DIAGNOSIS — I10 Essential (primary) hypertension: Secondary | ICD-10-CM

## 2012-08-10 DIAGNOSIS — M542 Cervicalgia: Secondary | ICD-10-CM

## 2012-08-10 DIAGNOSIS — M5412 Radiculopathy, cervical region: Secondary | ICD-10-CM

## 2012-08-10 NOTE — Assessment & Plan Note (Signed)
He gets an MRI done tomorrow to see if he has spinal stenosis, hnp, nerve impingement, etc.

## 2012-08-10 NOTE — Patient Instructions (Signed)

## 2012-08-10 NOTE — Assessment & Plan Note (Signed)
He will continue the current meds, also I have referred him to pain med

## 2012-08-10 NOTE — Assessment & Plan Note (Signed)
Continue current meds 

## 2012-08-10 NOTE — Progress Notes (Signed)
Subjective:    Patient ID: Alexander Moses, male    DOB: 08-08-1961, 51 y.o.   MRN: [id number]  Neck Pain  This is a recurrent problem. The current episode started in the past 7 days. The problem occurs intermittently. The problem has been gradually improving. The pain is associated with nothing. The pain is present in the right side. The quality of the pain is described as stabbing and aching. The pain is at a severity of 3/10. The pain is mild. Nothing aggravates the symptoms. The pain is same all the time. Associated symptoms include numbness (in his right arm) and tingling. Pertinent negatives include no chest pain, fever, headaches, leg pain, pain with swallowing, paresis, photophobia, syncope, trouble swallowing, visual change, weakness or weight loss. He has tried NSAIDs and oral narcotics (and lyrica) for the symptoms. The treatment provided moderate relief.      Review of Systems  Constitutional: Negative.  Negative for fever and weight loss.  HENT: Positive for neck pain and neck stiffness. Negative for trouble swallowing.   Eyes: Negative for photophobia.  Respiratory: Negative for cough, chest tightness, shortness of breath, wheezing and stridor.   Cardiovascular: Negative.  Negative for chest pain and syncope.  Gastrointestinal: Negative.   Genitourinary: Negative.   Skin: Negative.   Neurological: Positive for tingling and numbness (in his right arm). Negative for weakness and headaches.  Hematological: Negative for adenopathy. Does not bruise/bleed easily.  Psychiatric/Behavioral: Negative.        Objective:   Physical Exam  Vitals reviewed. Constitutional: He is oriented to person, place, and time. He appears well-developed and well-nourished. No distress.  HENT:  Head: Normocephalic and atraumatic.  Mouth/Throat: Oropharynx is clear and moist. No oropharyngeal exudate.  Eyes: Conjunctivae are normal. Right eye exhibits no discharge. Left eye exhibits no discharge. No  scleral icterus.  Neck: Normal range of motion. Neck supple. No JVD present. No tracheal deviation present. No thyromegaly present.  Cardiovascular: Normal rate, regular rhythm, normal heart sounds and intact distal pulses.  Exam reveals no gallop and no friction rub.   No murmur heard. Pulmonary/Chest: Effort normal and breath sounds normal. No stridor. No respiratory distress. He has no wheezes. He has no rales. He exhibits no tenderness.  Abdominal: Soft. Bowel sounds are normal. He exhibits no distension. There is no tenderness. There is no rebound and no guarding.  Musculoskeletal: Normal range of motion. He exhibits no edema and no tenderness.       Cervical back: Normal.  Lymphadenopathy:    He has no cervical adenopathy.  Neurological: He is alert and oriented to person, place, and time. He has normal strength. He displays no atrophy, no tremor and normal reflexes. No cranial nerve deficit or sensory deficit. He exhibits normal muscle tone. He displays a negative Romberg sign. He displays no seizure activity. Coordination and gait normal.  Reflex Scores:      Tricep reflexes are 0 on the right side and 0 on the left side.      Bicep reflexes are 0 on the right side and 0 on the left side.      Brachioradialis reflexes are 0 on the right side and 0 on the left side.      Patellar reflexes are 1+ on the right side and 1+ on the left side.      Achilles reflexes are 1+ on the right side and 1+ on the left side. Skin: Skin is warm and dry. No rash noted. He is  not diaphoretic. No erythema. No pallor.     Lab Results  Component Value Date   WBC 6.0 01/08/2012   HGB 13.0 01/08/2012   HCT 40.0 01/08/2012   PLT 231.0 01/08/2012   GLUCOSE 90 01/08/2012   CHOL 236* 01/08/2012   TRIG 125.0 01/08/2012   HDL 47.00 01/08/2012   LDLDIRECT 161.4 01/08/2012   LDLCALC 112* 09/08/2011   ALT 30 01/08/2012   AST 22 01/08/2012   NA 142 01/08/2012   K 4.3 01/08/2012   CL 106 01/08/2012   CREATININE 1.1  01/08/2012   BUN 8 01/08/2012   CO2 29 01/08/2012   TSH 1.25 07/03/2011   PSA 1.11 07/03/2011   HGBA1C 6.0 01/08/2012   Dg Cervical Spine Complete  08/05/2012  *RADIOLOGY REPORT*  Clinical Data:  Neck pain radiating down right arm, no known injury  CERVICAL SPINE - COMPLETE 4+ VIEW  Comparison: None  Findings: 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 Disc space narrowing with endplate spur formation C6-C7. Focal kyphosis at C4-C5 with minimal disc space narrowing. Vertebral body and disc space heights otherwise maintained. Prevertebral soft tissues normal thickness. No acute fracture or subluxation. Encroachment upon left cervical neural foramen at C6-C7 by uncovertebral spurs. Minimal narrowing of the right C3-C4 neural foramen by uncovertebral and facet hypertrophy. C1-C2 alignment normal. Lung apices clear. Calcified stylohyoid ligaments.  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 Scattered degenerative changes as above.   Original Report Authenticated By: Lollie Marrow, M.D.      Assessment & Plan:

## 2012-08-10 NOTE — Assessment & Plan Note (Signed)
His BP is well controlled 

## 2012-08-11 ENCOUNTER — Other Ambulatory Visit: Payer: PRIVATE HEALTH INSURANCE

## 2012-08-18 ENCOUNTER — Other Ambulatory Visit: Payer: PRIVATE HEALTH INSURANCE

## 2012-09-05 ENCOUNTER — Ambulatory Visit: Payer: PRIVATE HEALTH INSURANCE | Admitting: Internal Medicine

## 2013-04-11 ENCOUNTER — Ambulatory Visit: Payer: PRIVATE HEALTH INSURANCE | Admitting: Internal Medicine

## 2013-04-13 ENCOUNTER — Ambulatory Visit: Payer: PRIVATE HEALTH INSURANCE | Admitting: Internal Medicine

## 2013-04-13 DIAGNOSIS — Z0289 Encounter for other administrative examinations: Secondary | ICD-10-CM

## 2013-06-27 ENCOUNTER — Telehealth: Payer: Self-pay | Admitting: Internal Medicine

## 2013-06-27 NOTE — Telephone Encounter (Signed)
Pt request sample for Azor. Please call pt

## 2013-06-28 NOTE — Telephone Encounter (Signed)
Pt has an appt for cpe 07/05/13.

## 2013-06-28 NOTE — Telephone Encounter (Signed)
Patient last seen 07/2012, has a total for 4 cancellations and 1 no show since last visit. Patient is overdue for follow up and need to be seen. Message routed to scheduler to set appointment.

## 2013-07-05 ENCOUNTER — Encounter: Payer: Self-pay | Admitting: Internal Medicine

## 2013-07-05 ENCOUNTER — Ambulatory Visit (INDEPENDENT_AMBULATORY_CARE_PROVIDER_SITE_OTHER): Payer: No Typology Code available for payment source | Admitting: Internal Medicine

## 2013-07-05 VITALS — BP 130/82 | HR 65 | Temp 97.8°F | Resp 16 | Wt 242.0 lb

## 2013-07-05 DIAGNOSIS — G5601 Carpal tunnel syndrome, right upper limb: Secondary | ICD-10-CM

## 2013-07-05 DIAGNOSIS — G5600 Carpal tunnel syndrome, unspecified upper limb: Secondary | ICD-10-CM | POA: Insufficient documentation

## 2013-07-05 DIAGNOSIS — I10 Essential (primary) hypertension: Secondary | ICD-10-CM

## 2013-07-05 MED ORDER — AMLODIPINE-OLMESARTAN 5-20 MG PO TABS: 1.0000 | ORAL_TABLET | Freq: Every day | ORAL | Status: DC

## 2013-07-05 MED ORDER — DICLOFENAC 35 MG PO CAPS: 1.0000 | ORAL_CAPSULE | Freq: Three times a day (TID) | ORAL | Status: DC | PRN

## 2013-07-05 NOTE — Assessment & Plan Note (Signed)
I have asked him to get a NCS/EMG done to confirm this and to also check her an ulnar or cervical neuropathy Will treat the pain with an nsaid and have asked him to wear a splint

## 2013-07-05 NOTE — Progress Notes (Signed)
  Subjective:    Patient ID: Alexander Moses, male    DOB: 1961/07/12, 52 y.o.   MRN: [id number]  HPI  He returns c/o diffuse hand and wrist pain for 3-4 months. He does repetitive activity but does not recall and specific trauma or injury. He also has numbness and tingling in his right hand over the 4th and 5th fingers.   Review of Systems  All other systems reviewed and are negative.       Objective:   Physical Exam  Vitals reviewed. Constitutional: He is oriented to person, place, and time. He appears well-developed and well-nourished. No distress.  HENT:  Head: Normocephalic and atraumatic.  Mouth/Throat: Oropharynx is clear and moist. No oropharyngeal exudate.  Eyes: Conjunctivae are normal. Right eye exhibits no discharge. Left eye exhibits no discharge. No scleral icterus.  Neck: Normal range of motion. Neck supple. No JVD present. No tracheal deviation present. No thyromegaly present.  Cardiovascular: Normal rate, regular rhythm, normal heart sounds and intact distal pulses.  Exam reveals no gallop and no friction rub.   No murmur heard. Pulmonary/Chest: Effort normal and breath sounds normal. No stridor. No respiratory distress. He has no wheezes. He has no rales. He exhibits no tenderness.  Abdominal: Soft. Bowel sounds are normal. He exhibits no distension and no mass. There is no tenderness. There is no rebound and no guarding.  Musculoskeletal: Normal range of motion. He exhibits no edema and no tenderness.       Right wrist: Normal. He exhibits normal range of motion, no tenderness, no bony tenderness, no swelling, no effusion, no crepitus, no deformity and no laceration.       Right hand: Normal. He exhibits normal range of motion, no tenderness, no bony tenderness, normal two-point discrimination, normal capillary refill, no deformity, no laceration and no swelling. Normal sensation noted. Normal strength noted.  Lymphadenopathy:    He has no cervical adenopathy.   Neurological: He is alert and oriented to person, place, and time. He has normal reflexes. He displays normal reflexes. No cranial nerve deficit. He exhibits normal muscle tone. Coordination normal.  Skin: Skin is warm and dry. No rash noted. He is not diaphoretic. No erythema. No pallor.  Psychiatric: He has a normal mood and affect. His behavior is normal. Judgment and thought content normal.     Lab Results  Component Value Date   WBC 6.0 01/08/2012   HGB 13.0 01/08/2012   HCT 40.0 01/08/2012   PLT 231.0 01/08/2012   GLUCOSE 90 01/08/2012   CHOL 236* 01/08/2012   TRIG 125.0 01/08/2012   HDL 47.00 01/08/2012   LDLDIRECT 161.4 01/08/2012   LDLCALC 112* 09/08/2011   ALT 30 01/08/2012   AST 22 01/08/2012   NA 142 01/08/2012   K 4.3 01/08/2012   CL 106 01/08/2012   CREATININE 1.1 01/08/2012   BUN 8 01/08/2012   CO2 29 01/08/2012   TSH 1.25 07/03/2011   PSA 1.11 07/03/2011   HGBA1C 6.0 01/08/2012       Assessment & Plan:

## 2013-07-05 NOTE — Patient Instructions (Signed)

## 2013-08-01 ENCOUNTER — Encounter: Payer: Self-pay | Admitting: Neurology

## 2013-08-01 ENCOUNTER — Other Ambulatory Visit: Payer: Self-pay | Admitting: Internal Medicine

## 2013-08-01 ENCOUNTER — Telehealth: Payer: Self-pay

## 2013-08-01 DIAGNOSIS — G5600 Carpal tunnel syndrome, unspecified upper limb: Secondary | ICD-10-CM

## 2013-08-01 NOTE — Telephone Encounter (Signed)
Patient called Alexander Moses stating that he was referred to [facility] but they do not accept workers comp. Per pt, their office suggested he will need a referral to a facility that does accept it, possibly [facility]. Thanks

## 2013-10-06 ENCOUNTER — Telehealth: Payer: Self-pay

## 2013-10-06 NOTE — Telephone Encounter (Signed)
Patient called asking if samples of Azcor could be picked up for his blood pressure. Medication is on his current medication list and he was seen on 07/05/2013. Instructed patient that he could come in and pick up his samples today. Patient stated that he should be by today.

## 2013-10-18 ENCOUNTER — Ambulatory Visit (INDEPENDENT_AMBULATORY_CARE_PROVIDER_SITE_OTHER): Payer: No Typology Code available for payment source

## 2013-10-18 DIAGNOSIS — Z23 Encounter for immunization: Secondary | ICD-10-CM

## 2013-10-19 ENCOUNTER — Ambulatory Visit: Payer: No Typology Code available for payment source | Admitting: Internal Medicine

## 2013-11-07 ENCOUNTER — Encounter: Payer: No Typology Code available for payment source | Admitting: Internal Medicine

## 2013-11-07 DIAGNOSIS — Z0289 Encounter for other administrative examinations: Secondary | ICD-10-CM

## 2013-11-16 ENCOUNTER — Encounter: Payer: No Typology Code available for payment source | Admitting: Internal Medicine

## 2013-11-28 ENCOUNTER — Encounter: Payer: Self-pay | Admitting: Internal Medicine

## 2013-11-28 ENCOUNTER — Ambulatory Visit (INDEPENDENT_AMBULATORY_CARE_PROVIDER_SITE_OTHER): Payer: No Typology Code available for payment source | Admitting: Internal Medicine

## 2013-11-28 ENCOUNTER — Ambulatory Visit (INDEPENDENT_AMBULATORY_CARE_PROVIDER_SITE_OTHER): Payer: No Typology Code available for payment source

## 2013-11-28 VITALS — BP 136/90 | HR 74 | Temp 97.1°F | Resp 16 | Ht 70.0 in | Wt 274.0 lb

## 2013-11-28 DIAGNOSIS — E7800 Pure hypercholesterolemia, unspecified: Secondary | ICD-10-CM

## 2013-11-28 DIAGNOSIS — I10 Essential (primary) hypertension: Secondary | ICD-10-CM

## 2013-11-28 DIAGNOSIS — R7309 Other abnormal glucose: Secondary | ICD-10-CM

## 2013-11-28 DIAGNOSIS — Z0000 Encounter for general adult medical examination without abnormal findings: Secondary | ICD-10-CM

## 2013-11-28 DIAGNOSIS — G5600 Carpal tunnel syndrome, unspecified upper limb: Secondary | ICD-10-CM

## 2013-11-28 DIAGNOSIS — G5601 Carpal tunnel syndrome, right upper limb: Secondary | ICD-10-CM

## 2013-11-28 LAB — LIPID PANEL
Cholesterol: 218 mg/dL — ABNORMAL HIGH (ref 0–200)
HDL: 43.6 mg/dL (ref >39.00)
Total CHOL/HDL Ratio: 5
Triglycerides: 186 mg/dL — ABNORMAL HIGH (ref 0.0–149.0)
VLDL: 37.2 mg/dL (ref 0.0–40.0)

## 2013-11-28 LAB — COMPREHENSIVE METABOLIC PANEL
ALT: 29 U/L (ref 0–53)
AST: 24 U/L (ref 0–37)
Albumin: 4.2 g/dL (ref 3.5–5.2)
CO2: 26 mEq/L (ref 19–32)
Chloride: 107 mEq/L (ref 96–112)
Creatinine, Ser: 1 mg/dL (ref 0.4–1.5)
GFR: 99.27 mL/min (ref >60.00)
Potassium: 3.8 mEq/L (ref 3.5–5.1)
Sodium: 141 mEq/L (ref 135–145)
Total Bilirubin: 0.5 mg/dL (ref 0.3–1.2)
Total Protein: 7.2 g/dL (ref 6.0–8.3)

## 2013-11-28 LAB — PSA: PSA: 1.18 ng/mL (ref 0.10–4.00)

## 2013-11-28 LAB — URINALYSIS, ROUTINE W REFLEX MICROSCOPIC
Hgb urine dipstick: NEGATIVE
Ketones, ur: NEGATIVE
Leukocytes, UA: NEGATIVE
Nitrite: NEGATIVE
Specific Gravity, Urine: >=1.03 — AB (ref 1.000–1.030)
Urine Glucose: NEGATIVE
pH: 6 (ref 5.0–8.0)

## 2013-11-28 LAB — CBC WITH DIFFERENTIAL/PLATELET
Basophils Absolute: 0 10*3/uL (ref 0.0–0.1)
Eosinophils Absolute: 0 10*3/uL (ref 0.0–0.7)
HCT: 39 % (ref 39.0–52.0)
Hemoglobin: 12.8 g/dL — ABNORMAL LOW (ref 13.0–17.0)
Lymphs Abs: 3.5 10*3/uL (ref 0.7–4.0)
MCHC: 32.7 g/dL (ref 30.0–36.0)
Monocytes Absolute: 0.5 10*3/uL (ref 0.1–1.0)
Monocytes Relative: 7.4 % (ref 3.0–12.0)
Neutro Abs: 2.9 10*3/uL (ref 1.4–7.7)
Platelets: 263 10*3/uL (ref 150.0–400.0)
RDW: 15.5 % — ABNORMAL HIGH (ref 11.5–14.6)

## 2013-11-28 LAB — HEMOGLOBIN A1C: Hgb A1c MFr Bld: 6.1 % (ref 4.6–6.5)

## 2013-11-28 LAB — TSH: TSH: 1.58 u[IU]/mL (ref 0.35–5.50)

## 2013-11-28 MED ORDER — AMLODIPINE-OLMESARTAN 5-20 MG PO TABS: 1.0000 | ORAL_TABLET | Freq: Every day | ORAL | Status: DC

## 2013-11-28 NOTE — Assessment & Plan Note (Signed)
Exam done Vaccines were reviewed Labs ordered Pt ed material was given 

## 2013-11-28 NOTE — Patient Instructions (Signed)
Health Maintenance, Males A healthy lifestyle and preventative care can promote health and wellness.  Maintain regular health, dental, and eye exams.  Eat a healthy diet. Foods like vegetables, fruits, whole grains, low-fat dairy products, and lean protein foods contain the nutrients you need without too many calories. Decrease your intake of foods high in solid fats, added sugars, and salt. Get information about a proper diet from your caregiver, if necessary.  Regular physical exercise is one of the most important things you can do for your health. Most adults should get at least 150 minutes of moderate-intensity exercise (any activity that increases your heart rate and causes you to sweat) each week. In addition, most adults need muscle-strengthening exercises on 2 or more days a week.   Maintain a healthy weight. The body mass index (BMI) is a screening tool to identify possible weight problems. It provides an estimate of body fat based on height and weight. Your caregiver can help determine your BMI, and can help you achieve or maintain a healthy weight. For adults 20 years and older:  A BMI below 18.5 is considered underweight.  A BMI of 18.5 to 24.9 is normal.  A BMI of 25 to 29.9 is considered overweight.  A BMI of 30 and above is considered obese.  Maintain normal blood lipids and cholesterol by exercising and minimizing your intake of saturated fat. Eat a balanced diet with plenty of fruits and vegetables. Blood tests for lipids and cholesterol should begin at age 20 and be repeated every 5 years. If your lipid or cholesterol levels are high, you are over 50, or you are a high risk for heart disease, you may need your cholesterol levels checked more frequently.Ongoing high lipid and cholesterol levels should be treated with medicines, if diet and exercise are not effective.  If you smoke, find out from your caregiver how to quit. If you do not use tobacco, do not start.  Lung  cancer screening is recommended for adults aged 55 80 years who are at high risk for developing lung cancer because of a history of smoking. Yearly low-dose computed tomography (CT) is recommended for people who have at least a 30-pack-year history of smoking and are a current smoker or have quit within the past 15 years. A pack year of smoking is smoking an average of 1 pack of cigarettes a day for 1 year (for example: 1 pack a day for 30 years or 2 packs a day for 15 years). Yearly screening should continue until the smoker has stopped smoking for at least 15 years. Yearly screening should also be stopped for people who develop a health problem that would prevent them from having lung cancer treatment.  If you choose to drink alcohol, do not exceed 2 drinks per day. One drink is considered to be 12 ounces (355 mL) of beer, 5 ounces (148 mL) of wine, or 1.5 ounces (44 mL) of liquor.  Avoid use of street drugs. Do not share needles with anyone. Ask for help if you need support or instructions about stopping the use of drugs.  High blood pressure causes heart disease and increases the risk of stroke. Blood pressure should be checked at least every 1 to 2 years. Ongoing high blood pressure should be treated with medicines if weight loss and exercise are not effective.  If you are 45 to 53 years old, ask your caregiver if you should take aspirin to prevent heart disease.  Diabetes screening involves taking a blood   sample to check your fasting blood sugar level. This should be done once every 3 years, after age 45, if you are within normal weight and without risk factors for diabetes. Testing should be considered at a younger age or be carried out more frequently if you are overweight and have at least 1 risk factor for diabetes.  Colorectal cancer can be detected and often prevented. Most routine colorectal cancer screening begins at the age of 50 and continues through age 75. However, your caregiver may  recommend screening at an earlier age if you have risk factors for colon cancer. On a yearly basis, your caregiver may provide home test kits to check for hidden blood in the stool. Use of a small camera at the end of a tube, to directly examine the colon (sigmoidoscopy or colonoscopy), can detect the earliest forms of colorectal cancer. Talk to your caregiver about this at age 50, when routine screening begins. Direct examination of the colon should be repeated every 5 to 10 years through age 75, unless early forms of pre-cancerous polyps or small growths are found.  Hepatitis C blood testing is recommended for all people born from 1945 through 1965 and any individual with known risks for hepatitis C.  Healthy men should no longer receive prostate-specific antigen (PSA) blood tests as part of routine cancer screening. Consult with your caregiver about prostate cancer screening.  Testicular cancer screening is not recommended for adolescents or adult males who have no symptoms. Screening includes self-exam, caregiver exam, and other screening tests. Consult with your caregiver about any symptoms you have or any concerns you have about testicular cancer.  Practice safe sex. Use condoms and avoid high-risk sexual practices to reduce the spread of sexually transmitted infections (STIs).  Use sunscreen. Apply sunscreen liberally and repeatedly throughout the day. You should seek shade when your shadow is shorter than you. Protect yourself by wearing long sleeves, pants, a wide-brimmed hat, and sunglasses year round, whenever you are outdoors.  Notify your caregiver of new moles or changes in moles, especially if there is a change in shape or color. Also notify your caregiver if a mole is larger than the size of a pencil eraser.  A one-time screening for abdominal aortic aneurysm (AAA) and surgical repair of large AAAs by sound wave imaging (ultrasonography) is recommended for ages 65 to 75 years who are  current or former smokers.  Stay current with your immunizations. Document Released: 05/28/2008 Document Revised: 03/27/2013 Document Reviewed: 04/27/2011 [employer] Patient Information 2014 [employer]. Hypertension As your heart beats, it forces blood through your arteries. This force is your blood pressure. If the pressure is too high, it is called hypertension (HTN) or high blood pressure. HTN is dangerous because you may have it and not know it. High blood pressure may mean that your heart has to work harder to pump blood. Your arteries may be narrow or stiff. The extra work puts you at risk for heart disease, stroke, and other problems.  Blood pressure consists of two numbers, a higher number over a lower, 110/72, for example. It is stated as "110 over 72." The ideal is below 120 for the top number (systolic) and under 80 for the bottom (diastolic). Write down your blood pressure today. You should pay close attention to your blood pressure if you have certain conditions such as:  Heart failure.  Prior heart attack.  Diabetes  Chronic kidney disease.  Prior stroke.  Multiple risk factors for heart disease. To see   if you have HTN, your blood pressure should be measured while you are seated with your arm held at the level of the heart. It should be measured at least twice. A one-time elevated blood pressure reading (especially in the Emergency Department) does not mean that you need treatment. There may be conditions in which the blood pressure is different between your right and left arms. It is important to see your caregiver soon for a recheck. Most people have essential hypertension which means that there is not a specific cause. This type of high blood pressure may be lowered by changing lifestyle factors such as:  Stress.  Smoking.  Lack of exercise.  Excessive weight.  Drug/tobacco/alcohol use.  Eating less salt. Most people do not have symptoms from high blood pressure  until it has caused damage to the body. Effective treatment can often prevent, delay or reduce that damage. TREATMENT  When a cause has been identified, treatment for high blood pressure is directed at the cause. There are a large number of medications to treat HTN. These fall into several categories, and your caregiver will help you select the medicines that are best for you. Medications may have side effects. You should review side effects with your caregiver. If your blood pressure stays high after you have made lifestyle changes or started on medicines,   Your medication(s) may need to be changed.  Other problems may need to be addressed.  Be certain you understand your prescriptions, and know how and when to take your medicine.  Be sure to follow up with your caregiver within the time frame advised (usually within two weeks) to have your blood pressure rechecked and to review your medications.  If you are taking more than one medicine to lower your blood pressure, make sure you know how and at what times they should be taken. Taking two medicines at the same time can result in blood pressure that is too low. SEEK IMMEDIATE MEDICAL CARE IF:  You develop a severe headache, blurred or changing vision, or confusion.  You have unusual weakness or numbness, or a faint feeling.  You have severe chest or abdominal pain, vomiting, or breathing problems. MAKE SURE YOU:   Understand these instructions.  Will watch your condition.  Will get help right away if you are not doing well or get worse. Document Released: 11/30/2005 Document Revised: 02/22/2012 Document Reviewed: 07/20/2008 [employer] Patient Information 2014 [employer].  

## 2013-11-28 NOTE — Assessment & Plan Note (Signed)
He wants to see ortho about this

## 2013-11-28 NOTE — Progress Notes (Signed)
Pre visit review using our clinic review tool, if applicable. No additional management support is needed unless otherwise documented below in the visit note. 

## 2013-11-28 NOTE — Assessment & Plan Note (Signed)
His LDL goal is < 130 Today I will check his FLP and will treat if indicated

## 2013-11-28 NOTE — Progress Notes (Signed)
Subjective:    Patient ID: Alexander Moses, male    DOB: 09-10-1961, 52 y.o.   MRN: [id number]  Hypertension This is a chronic problem. The current episode started more than 1 year ago. The problem is unchanged. The problem is controlled. Pertinent negatives include no anxiety, blurred vision, chest pain, headaches, malaise/fatigue, neck pain, orthopnea, palpitations, peripheral edema, PND, shortness of breath or sweats. There are no associated agents to hypertension. Past treatments include calcium channel blockers and angiotensin blockers. The current treatment provides moderate improvement. Compliance problems include exercise and diet.       Review of Systems  Constitutional: Negative.  Negative for fever, chills, malaise/fatigue, diaphoresis, appetite change and fatigue.  HENT: Negative.   Eyes: Negative.  Negative for blurred vision.  Respiratory: Negative.  Negative for apnea, cough, choking, chest tightness, shortness of breath, wheezing and stridor.   Cardiovascular: Negative.  Negative for chest pain, palpitations, orthopnea, leg swelling and PND.  Gastrointestinal: Negative.  Negative for nausea, abdominal pain, diarrhea, constipation and blood in stool.  Endocrine: Negative.   Genitourinary: Negative.   Musculoskeletal: Negative.  Negative for neck pain.  Skin: Negative.   Allergic/Immunologic: Negative.   Neurological: Negative.  Negative for dizziness, tremors, seizures, syncope, facial asymmetry, speech difficulty, weakness, light-headedness, numbness and headaches.  Hematological: Negative.  Negative for adenopathy. Does not bruise/bleed easily.  Psychiatric/Behavioral: Negative.        Objective:   Physical Exam  Vitals reviewed. Constitutional: He is oriented to person, place, and time. He appears well-developed and well-nourished. No distress.  HENT:  Head: Normocephalic and atraumatic.  Mouth/Throat: Oropharynx is clear and moist. No oropharyngeal exudate.    Eyes: Conjunctivae are normal. Right eye exhibits no discharge. Left eye exhibits no discharge. No scleral icterus.  Neck: Normal range of motion. Neck supple. No JVD present. No tracheal deviation present. No thyromegaly present.  Cardiovascular: Normal rate, regular rhythm, normal heart sounds and intact distal pulses.  Exam reveals no gallop and no friction rub.   No murmur heard. Pulmonary/Chest: Effort normal and breath sounds normal. No stridor. No respiratory distress. He has no wheezes. He has no rales. He exhibits no tenderness.  Abdominal: Soft. Bowel sounds are normal. He exhibits no distension and no mass. There is no tenderness. There is no rebound and no guarding. Hernia confirmed negative in the right inguinal area and confirmed negative in the left inguinal area.  Genitourinary: Rectum normal, prostate normal, testes normal and penis normal. Rectal exam shows no external hemorrhoid, no internal hemorrhoid, no fissure, no mass, no tenderness and anal tone normal. Guaiac negative stool. Prostate is not enlarged and not tender. Right testis shows no mass, no swelling and no tenderness. Right testis is descended. Left testis shows no mass, no swelling and no tenderness. Left testis is descended. Uncircumcised. No phimosis, paraphimosis, hypospadias, penile erythema or penile tenderness. No discharge found.  Musculoskeletal: Normal range of motion. He exhibits no edema and no tenderness.  Lymphadenopathy:    He has no cervical adenopathy.       Right: No inguinal adenopathy present.       Left: No inguinal adenopathy present.  Neurological: He is oriented to person, place, and time.  Skin: Skin is warm and dry. No rash noted. He is not diaphoretic. No erythema. No pallor.  Psychiatric: He has a normal mood and affect. His behavior is normal. Judgment and thought content normal.     Lab Results  Component Value Date   WBC 6.0  01/08/2012   HGB 13.0 01/08/2012   HCT 40.0 01/08/2012    PLT 231.0 01/08/2012   GLUCOSE 90 01/08/2012   CHOL 236* 01/08/2012   TRIG 125.0 01/08/2012   HDL 47.00 01/08/2012   LDLDIRECT 161.4 01/08/2012   LDLCALC 112* 09/08/2011   ALT 30 01/08/2012   AST 22 01/08/2012   NA 142 01/08/2012   K 4.3 01/08/2012   CL 106 01/08/2012   CREATININE 1.1 01/08/2012   BUN 8 01/08/2012   CO2 29 01/08/2012   TSH 1.25 07/03/2011   PSA 1.11 07/03/2011   HGBA1C 6.0 01/08/2012       Assessment & Plan:

## 2013-11-28 NOTE — Assessment & Plan Note (Signed)
I will check his A1C to see if he has developed DM2 

## 2013-11-28 NOTE — Assessment & Plan Note (Signed)
He has adequate BP control He will work on his lifestyle modifications

## 2013-11-29 LAB — LDL CHOLESTEROL, DIRECT: Direct LDL: 165.2 mg/dL

## 2013-11-29 LAB — HEPATITIS C ANTIBODY: HCV Ab: NEGATIVE

## 2013-11-30 ENCOUNTER — Encounter: Payer: Self-pay | Admitting: Internal Medicine

## 2014-02-26 ENCOUNTER — Other Ambulatory Visit: Payer: Self-pay

## 2014-02-26 DIAGNOSIS — I10 Essential (primary) hypertension: Secondary | ICD-10-CM

## 2014-02-26 MED ORDER — AMLODIPINE-OLMESARTAN 5-20 MG PO TABS: 1.0000 | ORAL_TABLET | Freq: Every day | ORAL | Status: DC

## 2014-03-05 ENCOUNTER — Other Ambulatory Visit: Payer: Self-pay

## 2014-03-05 DIAGNOSIS — I10 Essential (primary) hypertension: Secondary | ICD-10-CM

## 2014-03-05 MED ORDER — AMLODIPINE-OLMESARTAN 5-20 MG PO TABS: 1.0000 | ORAL_TABLET | Freq: Every day | ORAL | Status: DC

## 2014-03-27 ENCOUNTER — Telehealth: Payer: Self-pay

## 2014-03-27 DIAGNOSIS — I10 Essential (primary) hypertension: Secondary | ICD-10-CM

## 2014-03-27 MED ORDER — AMLODIPINE-OLMESARTAN 5-20 MG PO TABS: 1.0000 | ORAL_TABLET | Freq: Every day | ORAL | Status: DC

## 2014-03-27 NOTE — Telephone Encounter (Signed)
Mr. Alexander Moses called and requested samples of Azor. Samples provided, Lot [id number] Exp 11/17

## 2014-05-04 ENCOUNTER — Ambulatory Visit: Payer: Self-pay | Admitting: Internal Medicine

## 2014-05-15 ENCOUNTER — Other Ambulatory Visit: Payer: Self-pay | Admitting: *Deleted

## 2014-05-15 DIAGNOSIS — I10 Essential (primary) hypertension: Secondary | ICD-10-CM

## 2014-05-15 MED ORDER — AMLODIPINE-OLMESARTAN 5-20 MG PO TABS: 1.0000 | ORAL_TABLET | Freq: Every day | ORAL | Status: DC

## 2014-08-01 ENCOUNTER — Other Ambulatory Visit: Payer: Self-pay

## 2014-08-01 DIAGNOSIS — I10 Essential (primary) hypertension: Secondary | ICD-10-CM

## 2014-08-01 MED ORDER — AMLODIPINE-OLMESARTAN 5-20 MG PO TABS: 1.0000 | ORAL_TABLET | Freq: Every day | ORAL | Status: DC

## 2014-08-08 ENCOUNTER — Telehealth: Payer: Self-pay | Admitting: Internal Medicine

## 2014-08-08 NOTE — Telephone Encounter (Signed)
Pt called stated Azor need to be change to generic due to insurance will not pay for the name brand. Pt is out of this med for two week now. Offer an appt since last ov was 11/2013. Please advise.

## 2014-08-08 NOTE — Telephone Encounter (Signed)
This has been addressed multiple times, pt need appt to discuss further. Thanks

## 2014-08-08 NOTE — Telephone Encounter (Signed)
appt is set.  °

## 2014-08-10 ENCOUNTER — Ambulatory Visit (INDEPENDENT_AMBULATORY_CARE_PROVIDER_SITE_OTHER): Payer: Self-pay

## 2014-08-10 ENCOUNTER — Encounter: Payer: Self-pay | Admitting: Family Medicine

## 2014-08-10 ENCOUNTER — Ambulatory Visit (INDEPENDENT_AMBULATORY_CARE_PROVIDER_SITE_OTHER): Payer: Self-pay | Admitting: Internal Medicine

## 2014-08-10 ENCOUNTER — Ambulatory Visit (INDEPENDENT_AMBULATORY_CARE_PROVIDER_SITE_OTHER): Payer: Self-pay | Admitting: Family Medicine

## 2014-08-10 ENCOUNTER — Encounter: Payer: Self-pay | Admitting: Internal Medicine

## 2014-08-10 VITALS — BP 146/94 | HR 71 | Temp 97.8°F | Resp 16 | Ht 68.0 in | Wt 253.0 lb

## 2014-08-10 VITALS — BP 132/90 | HR 71 | Ht 68.0 in | Wt 252.0 lb

## 2014-08-10 DIAGNOSIS — G5621 Lesion of ulnar nerve, right upper limb: Secondary | ICD-10-CM

## 2014-08-10 DIAGNOSIS — M25531 Pain in right wrist: Secondary | ICD-10-CM

## 2014-08-10 DIAGNOSIS — I10 Essential (primary) hypertension: Secondary | ICD-10-CM

## 2014-08-10 DIAGNOSIS — G5620 Lesion of ulnar nerve, unspecified upper limb: Secondary | ICD-10-CM

## 2014-08-10 DIAGNOSIS — M25539 Pain in unspecified wrist: Secondary | ICD-10-CM

## 2014-08-10 MED ORDER — AMLODIPINE BESYLATE 5 MG PO TABS: 5.0000 mg | ORAL_TABLET | Freq: Every day | ORAL | Status: DC

## 2014-08-10 MED ORDER — LOSARTAN POTASSIUM 100 MG PO TABS: 100.0000 mg | ORAL_TABLET | Freq: Every day | ORAL | Status: AC

## 2014-08-10 NOTE — Progress Notes (Signed)
Alexander Moses [address] [place], [place] [address] Phone: [phone] Subjective:    I'm seeing this patient by the request  of:  Sanda Linger, MD   CC: Wrist pain  [id number] Alexander Moses is a 53 y.o. male coming in with complaint of wrist pain. Patient has been diagnosed with carpal tunnel syndrome previously. Patient also has a past medical history significant for cervical radiculopathy. Did review patient's charts including imaging. 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 Patient states that his wrist is significantly tender mostly on the right side. Patient actually states that it seems to be more on his ring finger and pinky finger. Patient states that it hasn't numbness. Patient states that the pain can be severe enough that it does wake him up at night. 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 Denies though once again any type of weakness. Patient was a severity at 8/10 and seems to be worsening over the course of time.     Past medical history, social, surgical and family history all reviewed in electronic medical record.   Review of Systems: No headache, visual changes, nausea, vomiting, diarrhea, constipation, dizziness, abdominal pain, skin rash, fevers, chills, night sweats, weight loss, swollen lymph nodes, body aches, joint swelling, muscle aches, chest pain, shortness of breath, mood changes.   Objective Blood pressure 132/90, pulse 71, height  (1.727 m), weight 252 lb (114.306 kg), SpO2 96.00%.  General: No apparent distress alert and oriented x3 mood and affect normal, dressed appropriately.  HEENT: Pupils equal, extraocular movements intact  Respiratory: Patient's speak in full sentences and does not appear short of breath  Cardiovascular: No lower extremity edema, non tender, no  erythema  Skin: Warm dry intact with no signs of infection or rash on extremities or on axial skeleton.  Abdomen: Soft nontender  Neuro: Cranial nerves II through XII are intact, neurovascularly intact in all extremities with 2+ DTRs and 2+ pulses.  Lymph: No lymphadenopathy of posterior or anterior cervical chain or axillae bilaterally.  Gait normal with good balance and coordination.  MSK:  Non tender with full range of motion and good stability and symmetric strength and tone of shoulders, elbows,  hip, knee and ankles bilaterally.  Neck: Inspection unremarkable. No palpable stepoffs. Negative Spurling's maneuver. Full neck range of motion Grip strength and sensation normal in bilateral hands Strength good C4 to T1 distribution No sensory change to C4 to T1 Negative Hoffman sign bilaterally Reflexes normal Wrist: Right Inspection normal with no visible erythema or swelling. ROM smooth and normal with good flexion and extension and ulnar/radial deviation that is symmetrical with opposite wrist. Palpation is normal over metacarpals, navicular, lunate, and TFCC; tendons without tenderness/ swelling No snuffbox tenderness. Moderate tenderness over Canal of Guyon. Strength 5/5 in all directions without pain. Negative Finkelstein, tinel's and phalens. 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 Contralateral wrist unremarkable  MSK US performed of: Right wrist This study was ordered, performed, and interpreted by Terrilee Files D.O.  Wrist: All extensor compartments visualized and tendons all normal in appearance without fraying, tears, or sheath effusions. No effusion seen. TFCC intact. Scapholunate ligament intact. Carpal tunnel visualized and median nerve area normal measuring 1.36 cm in area squared flexor tendons all normal in appearance without fraying, tears, or sheath  effusions. Patient's ulnar nerve is very superficial and does  overlie patient's ulnar artery. Mild hypoechoic changes noted. Power doppler signal normal.  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

## 2014-08-10 NOTE — Patient Instructions (Signed)
Very nice to meet you.  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  Wear brace day and night for next 2 weeks then nightly for 2 weeks.  Ice 20 minutes 2 times daily. Usually after activity and before bed. Pennsaid twice daily for 5 days.  Exercises 3 times a week.  Come back in 3 weeks.

## 2014-08-10 NOTE — Assessment & Plan Note (Addendum)
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 We discussed different treatment options at this time. This is affecting patient's daily activities as well as potentially his job performance if he continues to we will be somewhat aggressive. In addition this patient's job with repetitive motion could be contributing to the pain. Patient was given a brace that he will wear the day and night for the next 2 weeks then nightly for 2 weeks. We discussed home exercises as well as some icing protocol. Patient was given topical anti-inflammatories to try. Warned of potential side effects. Patient will come back again in 3 weeks for further evaluation. Patient continues to have difficulty we can either consider a injection versus further workup including EMG and questionable x-ray and MRI if necessary. We'll discuss further at followup.

## 2014-08-10 NOTE — Progress Notes (Signed)
Pre visit review using our clinic review tool, if applicable. No additional management support is needed unless otherwise documented below in the visit note. 

## 2014-08-10 NOTE — Patient Instructions (Signed)

## 2014-08-10 NOTE — Progress Notes (Signed)
   Subjective:    Patient ID: Alexander Moses, male    DOB: [birth year], 53 y.o.   MRN: [id number]  Hypertension This is a chronic problem. The current episode started more than 1 year ago. The problem is unchanged. The problem is uncontrolled. Pertinent negatives include no anxiety, blurred vision, chest pain, headaches, malaise/fatigue, neck pain, orthopnea, palpitations, peripheral edema, PND, shortness of breath or sweats. There are no associated agents to hypertension. Past treatments include angiotensin blockers and calcium channel blockers. The current treatment provides moderate improvement. Compliance problems include diet and medication cost.       Review of Systems  Constitutional: Negative.  Negative for fever, chills, malaise/fatigue, diaphoresis, appetite change and fatigue.  HENT: Negative.   Eyes: Negative.  Negative for blurred vision.  Respiratory: Negative.  Negative for apnea, cough, choking, chest tightness, shortness of breath, wheezing and stridor.   Cardiovascular: Negative.  Negative for chest pain, palpitations, orthopnea, leg swelling and PND.  Gastrointestinal: Negative.  Negative for abdominal pain.  Endocrine: Negative.   Genitourinary: Negative.   Musculoskeletal: Negative.  Negative for neck pain.  Skin: Negative.  Negative for rash.  Allergic/Immunologic: Negative.   Neurological: Negative.  Negative for headaches.  Hematological: Negative.  Negative for adenopathy. Does not bruise/bleed easily.  Psychiatric/Behavioral: Negative.        Objective:   Physical Exam  Vitals reviewed. Constitutional: He is oriented to person, place, and time. He appears well-developed and well-nourished. No distress.  HENT:  Head: Normocephalic and atraumatic.  Mouth/Throat: Oropharynx is clear and moist. No oropharyngeal exudate.  Eyes: Conjunctivae are normal. Right eye exhibits no discharge. Left eye exhibits no discharge. No scleral icterus.  Neck: Normal range of  motion. Neck supple. No JVD present. No tracheal deviation present. No thyromegaly present.  Cardiovascular: Normal rate, regular rhythm, normal heart sounds and intact distal pulses.  Exam reveals no gallop.   No murmur heard. Pulmonary/Chest: Effort normal and breath sounds normal. No stridor. No respiratory distress. He has no wheezes. He has no rales. He exhibits no tenderness.  Abdominal: Soft. Bowel sounds are normal. He exhibits no distension and no mass. There is no tenderness. There is no rebound and no guarding.  Musculoskeletal: Normal range of motion. He exhibits no edema and no tenderness.  Lymphadenopathy:    He has no cervical adenopathy.  Neurological: He is oriented to person, place, and time.  Skin: Skin is warm and dry. No rash noted. He is not diaphoretic. No erythema. No pallor.  Psychiatric: He has a normal mood and affect. His behavior is normal. Judgment and thought content normal.     Lab Results  Component Value Date   WBC 6.9 11/28/2013   HGB 12.8* 11/28/2013   HCT 39.0 11/28/2013   PLT 263.0 11/28/2013   GLUCOSE 93 11/28/2013   CHOL 218* 11/28/2013   TRIG 186.0* 11/28/2013   HDL 43.60 11/28/2013   LDLDIRECT 165.2 11/28/2013   LDLCALC 112* 09/08/2011   ALT 29 11/28/2013   AST 24 11/28/2013   NA 141 11/28/2013   K 3.8 11/28/2013   CL 107 11/28/2013   CREATININE 1.0 11/28/2013   BUN 12 11/28/2013   CO2 26 11/28/2013   TSH 1.58 11/28/2013   PSA 1.18 11/28/2013   HGBA1C 6.1 11/28/2013       Assessment & Plan:

## 2014-08-10 NOTE — Assessment & Plan Note (Addendum)
He wants to change his meds to generics He agrees to improve his lifestyle modifications

## 2014-09-06 ENCOUNTER — Ambulatory Visit: Payer: Self-pay | Admitting: Family Medicine

## 2014-09-13 ENCOUNTER — Ambulatory Visit: Payer: Self-pay | Admitting: Family Medicine

## 2014-09-13 DIAGNOSIS — Z0289 Encounter for other administrative examinations: Secondary | ICD-10-CM

## 2014-09-21 ENCOUNTER — Ambulatory Visit (INDEPENDENT_AMBULATORY_CARE_PROVIDER_SITE_OTHER): Payer: Self-pay | Admitting: Family Medicine

## 2014-09-21 ENCOUNTER — Encounter: Payer: Self-pay | Admitting: Family Medicine

## 2014-09-21 VITALS — BP 132/86 | HR 69 | Ht 68.0 in | Wt 252.0 lb

## 2014-09-21 DIAGNOSIS — M7521 Bicipital tendinitis, right shoulder: Secondary | ICD-10-CM

## 2014-09-21 DIAGNOSIS — G5621 Lesion of ulnar nerve, right upper limb: Secondary | ICD-10-CM

## 2014-09-21 NOTE — Progress Notes (Signed)
  Tawana ScaleZach Lurine Imel D.O. [facility] [address] [place], [place] [address] Phone: [phone] Subjective:    I'm seeing this patient by the request  of:  Sanda Lingerhomas Jones, MD   CC: Wrist pain follow up  IHK:[id number]:Subjective Alexander Moses is a 53 y.o. male coming in with complaint of wrist pain. Patient has been diagnosed with carpal tunnel syndrome previously. Patient also has a past medical history significant for cervical radiculopathy. Did review patient's charts including imaging. 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 Patient was found to have more of an ulnar neuropathy. Patient's median nerve actually seemed to be unremarkable with a normal size. Patient was given a brace, home exercises, icing protocol and states that his wrist pain is significantly better. Patient is having some mild forearm discomfort to. States it is mostly on the medial aspect. Denies any significant numbness in the hand and states that his grip strength has improved. Patient states though that if he does repetitive flexion of the wrist he can have some discomfort in the forearm. Still significantly better than what he was doing previously.    Past medical history, social, surgical and family history all reviewed in electronic medical record.   Review of Systems: No headache, visual changes, nausea, vomiting, diarrhea, constipation, dizziness, abdominal pain, skin rash, fevers, chills, night sweats, weight loss, swollen lymph nodes, body aches, joint swelling, muscle aches, chest pain, shortness of breath, mood changes.   Objective Blood pressure 132/86, pulse 69, height 5\' 8"  (1.727 m), weight 252 lb (114.306 kg), SpO2 95.00%.  General: No apparent distress alert and oriented x3 mood and affect normal, dressed appropriately.  HEENT: Pupils equal, extraocular movements intact  Respiratory: Patient's speak  in full sentences and does not appear short of breath  Cardiovascular: No lower extremity edema, non tender, no erythema  Skin: Warm dry intact with no signs of infection or rash on extremities or on axial skeleton.  Abdomen: Soft nontender  Neuro: Cranial nerves II through XII are intact, neurovascularly intact in all extremities with 2+ DTRs and 2+ pulses.  Lymph: No lymphadenopathy of posterior or anterior cervical chain or axillae bilaterally.  Gait normal with good balance and coordination.  MSK:  Non tender with full range of motion and good stability and symmetric strength and tone of shoulders, elbows,  hip, knee and ankles bilaterally.  Neck: Inspection unremarkable. No palpable stepoffs. Negative Spurling's maneuver. Full neck range of motion Grip strength and sensation normal in bilateral hands Strength good C4 to T1 distribution No sensory change to C4 to T1 Negative Hoffman sign bilaterally Reflexes normal Wrist: Right Inspection normal with no visible erythema or swelling. ROM smooth and normal with good flexion and extension and ulnar/radial deviation that is symmetrical with opposite wrist. Palpation is normal over metacarpals, navicular, lunate, and TFCC; tendons without tenderness/ swelling No snuffbox tenderness. Nontender on exam today. Strength 5/5 in all directions without pain. Negative Finkelstein, tinel's and phalens. 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 Patient does have tightness of the brachial radialis tendon which is causing patient to lack the last 5 of extension of the elbow compared to the contralateral side. Contralateral wrist unremarkable      Impression and Recommendations:     This case required medical decision making of moderate complexity.

## 2014-09-21 NOTE — Assessment & Plan Note (Signed)
I believe the patient is having more of a distal biceps tendinitis and more of the brachial radialis tendon. I think he is getting stiffness that is causing him to lack some of the range of motion. With patient's stretching he can get patient to full range of motion. There is no radiation pain. Differential does include radiculogram his cervical spine which we discussed previously. Patient though seems to be doing relatively well with no weakness. Patient will continue with a conservative therapy and then come back and see me again in 3 weeks. We discussed the exercises in great detail and how the anti-inflammatories can be beneficial.

## 2014-09-21 NOTE — Assessment & Plan Note (Signed)
Patient's ulnar neuropathy has improved with conservative therapy me discussed continuing exercises 2 times a week for the next 6 weeks. We discussed continuing all the other modalities including icing as well as topical anti-inflammatory on an as-needed basis. Patient does not need a wrist brace anymore at this time. Patient has any re\re exacerbation he'll come back for further followup.

## 2014-09-21 NOTE — Patient Instructions (Signed)
Good to se eyou You are doing great Ice 20 minutes at the end of the day Stretch that forearm out well.  New exercises will help Keep the wrist exercises 2 times a week for another 6 weeks.  See me again in 6 weeks.

## 2014-11-14 ENCOUNTER — Encounter: Payer: Self-pay | Admitting: Internal Medicine

## 2014-11-14 ENCOUNTER — Ambulatory Visit (INDEPENDENT_AMBULATORY_CARE_PROVIDER_SITE_OTHER)
Admission: RE | Admit: 2014-11-14 | Discharge: 2014-11-14 | Disposition: A | Discharge status: Home / Self Care | Payer: 59 | Source: Ambulatory Visit | Attending: Internal Medicine | Admitting: Internal Medicine

## 2014-11-14 ENCOUNTER — Ambulatory Visit (INDEPENDENT_AMBULATORY_CARE_PROVIDER_SITE_OTHER): Payer: 59 | Admitting: Internal Medicine

## 2014-11-14 VITALS — BP 130/90 | HR 72 | Temp 97.9°F | Resp 16 | Ht 68.0 in | Wt 250.0 lb

## 2014-11-14 DIAGNOSIS — R05 Cough: Secondary | ICD-10-CM

## 2014-11-14 DIAGNOSIS — F528 Other sexual dysfunction not due to a substance or known physiological condition: Secondary | ICD-10-CM

## 2014-11-14 DIAGNOSIS — J209 Acute bronchitis, unspecified: Secondary | ICD-10-CM

## 2014-11-14 DIAGNOSIS — R059 Cough, unspecified: Secondary | ICD-10-CM | POA: Insufficient documentation

## 2014-11-14 DIAGNOSIS — J45909 Unspecified asthma, uncomplicated: Secondary | ICD-10-CM

## 2014-11-14 MED ORDER — ALBUTEROL SULFATE 108 (90 BASE) MCG/ACT IN AEPB: 1.0000 | INHALATION_SPRAY | Freq: Four times a day (QID) | RESPIRATORY_TRACT | Status: DC | PRN

## 2014-11-14 MED ORDER — AZITHROMYCIN 500 MG PO TABS: 500.0000 mg | ORAL_TABLET | Freq: Every day | ORAL | Status: DC

## 2014-11-14 MED ORDER — HYDROCODONE-HOMATROPINE 5-1.5 MG/5ML PO SYRP: 5.0000 mL | ORAL_SOLUTION | Freq: Three times a day (TID) | ORAL | Status: DC | PRN

## 2014-11-14 NOTE — Progress Notes (Signed)
   Subjective:    Patient ID: Alexander Moses, male    DOB: 09/12/1961, 53 y.o.   MRN: [id number]  Cough This is a new problem. The current episode started 1 to 4 weeks ago. The problem has been unchanged. The cough is productive of sputum and productive of purulent sputum. Associated symptoms include chills, shortness of breath and wheezing. Pertinent negatives include no chest pain, ear congestion, ear pain, fever, headaches, heartburn, hemoptysis, myalgias, nasal congestion, postnasal drip, rash, rhinorrhea, sore throat, sweats or weight loss. The symptoms are aggravated by cold air. He has tried OTC cough suppressant for the symptoms. The treatment provided mild relief. There is no history of asthma, bronchiectasis, bronchitis, COPD, emphysema, environmental allergies or pneumonia.      Review of Systems  Constitutional: Positive for chills. Negative for fever, weight loss, diaphoresis, activity change, appetite change and fatigue.  HENT: Negative.  Negative for ear pain, postnasal drip, rhinorrhea and sore throat.   Eyes: Negative.   Respiratory: Positive for cough, shortness of breath and wheezing. Negative for apnea, hemoptysis, choking, chest tightness and stridor.   Cardiovascular: Negative.  Negative for chest pain, palpitations and leg swelling.  Gastrointestinal: Negative.  Negative for heartburn and abdominal pain.  Endocrine: Negative.   Genitourinary: Negative.   Musculoskeletal: Negative.  Negative for myalgias, back pain, arthralgias and neck pain.  Skin: Negative.  Negative for rash.  Allergic/Immunologic: Negative.  Negative for environmental allergies.  Neurological: Negative.  Negative for headaches.  Hematological: Negative.  Negative for adenopathy. Does not bruise/bleed easily.  Psychiatric/Behavioral: Negative.        Objective:   Physical Exam  Constitutional: He is oriented to person, place, and time. He appears well-developed and well-nourished.  Non-toxic  appearance. He does not have a sickly appearance. He does not appear ill. No distress.  HENT:  Head: Normocephalic and atraumatic.  Mouth/Throat: Oropharynx is clear and moist. No oropharyngeal exudate.  Eyes: Conjunctivae are normal. Right eye exhibits no discharge. Left eye exhibits no discharge. No scleral icterus.  Neck: Normal range of motion. Neck supple. No JVD present. No tracheal deviation present. No thyromegaly present.  Cardiovascular: Normal rate, regular rhythm, normal heart sounds and intact distal pulses.  Exam reveals no gallop and no friction rub.   No murmur heard. Pulmonary/Chest: Effort normal. No accessory muscle usage or stridor. No respiratory distress. He has no decreased breath sounds. He has no wheezes. He has rhonchi in the right middle field and the left middle field. He has no rales. He exhibits no tenderness.  Abdominal: Soft. Bowel sounds are normal. He exhibits no distension and no mass. There is no tenderness. There is no rebound and no guarding.  Musculoskeletal: Normal range of motion. He exhibits no edema or tenderness.  Lymphadenopathy:    He has no cervical adenopathy.  Neurological: He is oriented to person, place, and time.  Skin: Skin is warm and dry. No rash noted. He is not diaphoretic. No erythema. No pallor.  Psychiatric: He has a normal mood and affect. His behavior is normal. Thought content normal.  Vitals reviewed.         Assessment & Plan:

## 2014-11-14 NOTE — Patient Instructions (Signed)
Cough, Adult  A cough is a reflex that helps clear your throat and airways. It can help heal the body or may be a reaction to an irritated airway. A cough may only last 2 or 3 weeks (acute) or may last more than 8 weeks (chronic).  CAUSES Acute cough:  Viral or bacterial infections. Chronic cough:  Infections.  Allergies.  Asthma.  Post-nasal drip.  Smoking.  Heartburn or acid reflux.  Some medicines.  Chronic lung problems (COPD).  Cancer. SYMPTOMS   Cough.  Fever.  Chest pain.  Increased breathing rate.  High-pitched whistling sound when breathing (wheezing).  Colored mucus that you cough up (sputum). TREATMENT   A bacterial cough may be treated with antibiotic medicine.  A viral cough must run its course and will not respond to antibiotics.  Your caregiver may recommend other treatments if you have a chronic cough. HOME CARE INSTRUCTIONS   Only take over-the-counter or prescription medicines for pain, discomfort, or fever as directed by your caregiver. Use cough suppressants only as directed by your caregiver.  Use a cold steam vaporizer or humidifier in your bedroom or home to help loosen secretions.  Sleep in a semi-upright position if your cough is worse at night.  Rest as needed.  Stop smoking if you smoke. SEEK IMMEDIATE MEDICAL CARE IF:   You have pus in your sputum.  Your cough starts to worsen.  You cannot control your cough with suppressants and are losing sleep.  You begin coughing up blood.  You have difficulty breathing.  You develop pain which is getting worse or is uncontrolled with medicine.  You have a fever. MAKE SURE YOU:   Understand these instructions.  Will watch your condition.  Will get help right away if you are not doing well or get worse. Document Released: 05/29/2011 Document Revised: 02/22/2012 Document Reviewed: 05/29/2011 Exit[employer] Patient Information 2015 [employer]. This information is not intended  to replace advice given to you by your health care provider. Make sure you discuss any questions you have with your health care provider.  

## 2014-11-14 NOTE — Progress Notes (Signed)
Pre visit review using our clinic review tool, if applicable. No additional management support is needed unless otherwise documented below in the visit note. 

## 2014-11-16 NOTE — Assessment & Plan Note (Signed)
Will treat the wheezing with albuterol Will treat the infection with zithromax and hycodan for the cough

## 2014-11-16 NOTE — Assessment & Plan Note (Signed)
CXR is normal Will treat for URI 

## 2014-12-05 ENCOUNTER — Ambulatory Visit: Payer: 59 | Admitting: Internal Medicine

## 2014-12-10 ENCOUNTER — Ambulatory Visit: Payer: Self-pay | Admitting: Internal Medicine

## 2015-01-22 ENCOUNTER — Ambulatory Visit: Payer: Self-pay | Admitting: Podiatry

## 2015-01-29 ENCOUNTER — Ambulatory Visit: Payer: Self-pay | Admitting: Podiatry

## 2015-02-14 ENCOUNTER — Inpatient Hospital Stay (HOSPITAL_COMMUNITY): Payer: 59

## 2015-02-14 ENCOUNTER — Emergency Department (HOSPITAL_COMMUNITY): Payer: 59

## 2015-02-14 ENCOUNTER — Encounter (HOSPITAL_COMMUNITY): Payer: Self-pay

## 2015-02-14 ENCOUNTER — Inpatient Hospital Stay (HOSPITAL_COMMUNITY)
Admission: EM | Admit: 2015-02-14 | Discharge: 2015-02-17 | DRG: 872 | Disposition: A | Discharge status: Home / Self Care | Payer: 59 | Attending: Internal Medicine | Admitting: Internal Medicine

## 2015-02-14 DIAGNOSIS — A4151 Sepsis due to Escherichia coli [E. coli]: Principal | ICD-10-CM | POA: Diagnosis present

## 2015-02-14 DIAGNOSIS — K219 Gastro-esophageal reflux disease without esophagitis: Secondary | ICD-10-CM | POA: Diagnosis present

## 2015-02-14 DIAGNOSIS — A419 Sepsis, unspecified organism: Secondary | ICD-10-CM | POA: Diagnosis present

## 2015-02-14 DIAGNOSIS — N390 Urinary tract infection, site not specified: Secondary | ICD-10-CM | POA: Diagnosis present

## 2015-02-14 DIAGNOSIS — Z79899 Other long term (current) drug therapy: Secondary | ICD-10-CM

## 2015-02-14 DIAGNOSIS — R6520 Severe sepsis without septic shock: Secondary | ICD-10-CM | POA: Diagnosis present

## 2015-02-14 DIAGNOSIS — R109 Unspecified abdominal pain: Secondary | ICD-10-CM

## 2015-02-14 DIAGNOSIS — N12 Tubulo-interstitial nephritis, not specified as acute or chronic: Secondary | ICD-10-CM | POA: Diagnosis present

## 2015-02-14 DIAGNOSIS — R7989 Other specified abnormal findings of blood chemistry: Secondary | ICD-10-CM | POA: Diagnosis present

## 2015-02-14 DIAGNOSIS — I10 Essential (primary) hypertension: Secondary | ICD-10-CM | POA: Diagnosis present

## 2015-02-14 DIAGNOSIS — Z833 Family history of diabetes mellitus: Secondary | ICD-10-CM

## 2015-02-14 DIAGNOSIS — Z8249 Family history of ischemic heart disease and other diseases of the circulatory system: Secondary | ICD-10-CM

## 2015-02-14 DIAGNOSIS — Z87891 Personal history of nicotine dependence: Secondary | ICD-10-CM

## 2015-02-14 DIAGNOSIS — E876 Hypokalemia: Secondary | ICD-10-CM | POA: Diagnosis present

## 2015-02-14 LAB — CBC WITH DIFFERENTIAL/PLATELET
BASOS ABS: 0 10*3/uL (ref 0.0–0.1)
BASOS PCT: 0 % (ref 0–1)
Basophils Absolute: 0 10*3/uL (ref 0.0–0.1)
Basophils Relative: 0 % (ref 0–1)
EOS ABS: 0 10*3/uL (ref 0.0–0.7)
EOS PCT: 0 % (ref 0–5)
Eosinophils Absolute: 0 10*3/uL (ref 0.0–0.7)
Eosinophils Relative: 0 % (ref 0–5)
HCT: 40.7 % (ref 39.0–52.0)
HEMATOCRIT: 36.3 % — AB (ref 39.0–52.0)
Hemoglobin: 13.6 g/dL (ref 13.0–17.0)
Hemoglobin: 11.6 g/dL — ABNORMAL LOW (ref 13.0–17.0)
LYMPHS ABS: 1.3 10*3/uL (ref 0.7–4.0)
LYMPHS PCT: 14 % (ref 12–46)
Lymphocytes Relative: 10 % — ABNORMAL LOW (ref 12–46)
Lymphs Abs: 1.1 10*3/uL (ref 0.7–4.0)
MCH: 27.6 pg (ref 26.0–34.0)
MCH: 26.7 pg (ref 26.0–34.0)
MCHC: 33.4 g/dL (ref 30.0–36.0)
MCHC: 32 g/dL (ref 30.0–36.0)
MCV: 82.6 fL (ref 78.0–100.0)
MCV: 83.4 fL (ref 78.0–100.0)
MONO ABS: 0 10*3/uL — AB (ref 0.1–1.0)
Monocytes Absolute: 0.3 10*3/uL (ref 0.1–1.0)
Monocytes Relative: 0 % — ABNORMAL LOW (ref 3–12)
Monocytes Relative: 3 % (ref 3–12)
NEUTROS ABS: 8.1 10*3/uL — AB (ref 1.7–7.7)
NEUTROS PCT: 86 % — AB (ref 43–77)
Neutro Abs: 9.8 10*3/uL — ABNORMAL HIGH (ref 1.7–7.7)
Neutrophils Relative %: 87 % — ABNORMAL HIGH (ref 43–77)
PLATELETS: 189 10*3/uL (ref 150–400)
Platelets: 207 10*3/uL (ref 150–400)
RBC: 4.93 MIL/uL (ref 4.22–5.81)
RBC: 4.35 MIL/uL (ref 4.22–5.81)
RDW: 15.7 % — ABNORMAL HIGH (ref 11.5–15.5)
RDW: 15.6 % — AB (ref 11.5–15.5)
WBC: 9.5 10*3/uL (ref 4.0–10.5)
WBC: 11.2 10*3/uL — ABNORMAL HIGH (ref 4.0–10.5)

## 2015-02-14 LAB — APTT: aPTT: 41 seconds — ABNORMAL HIGH (ref 24–37)

## 2015-02-14 LAB — URINALYSIS, ROUTINE W REFLEX MICROSCOPIC
Bilirubin Urine: NEGATIVE
Bilirubin Urine: NEGATIVE
GLUCOSE, UA: NEGATIVE mg/dL
GLUCOSE, UA: NEGATIVE mg/dL
Ketones, ur: NEGATIVE mg/dL
Ketones, ur: NEGATIVE mg/dL
Nitrite: POSITIVE — AB
Nitrite: NEGATIVE
PH: 6.5 (ref 5.0–8.0)
Protein, ur: NEGATIVE mg/dL
Protein, ur: NEGATIVE mg/dL
SPECIFIC GRAVITY, URINE: 1.011 (ref 1.005–1.030)
Specific Gravity, Urine: 1.011 (ref 1.005–1.030)
UROBILINOGEN UA: 0.2 mg/dL (ref 0.0–1.0)
Urobilinogen, UA: 0.2 mg/dL (ref 0.0–1.0)
pH: 7 (ref 5.0–8.0)

## 2015-02-14 LAB — COMPREHENSIVE METABOLIC PANEL
ALBUMIN: 4.3 g/dL (ref 3.5–5.2)
ALBUMIN: 3.5 g/dL (ref 3.5–5.2)
ALK PHOS: 91 U/L (ref 39–117)
ALT: 31 U/L (ref 0–53)
ALT: 35 U/L (ref 0–53)
ANION GAP: 8 (ref 5–15)
ANION GAP: 6 (ref 5–15)
AST: 58 U/L — ABNORMAL HIGH (ref 0–37)
AST: 29 U/L (ref 0–37)
Alkaline Phosphatase: 106 U/L (ref 39–117)
BUN: 12 mg/dL (ref 6–23)
BUN: 10 mg/dL (ref 6–23)
CO2: 28 mmol/L (ref 19–32)
CO2: 28 mmol/L (ref 19–32)
CREATININE: 1.16 mg/dL (ref 0.50–1.35)
CREATININE: 1.06 mg/dL (ref 0.50–1.35)
Calcium: 9.1 mg/dL (ref 8.4–10.5)
Calcium: 8.5 mg/dL (ref 8.4–10.5)
Chloride: 105 mmol/L (ref 96–112)
Chloride: 109 mmol/L (ref 96–112)
GFR calc Af Amer: 81 mL/min — ABNORMAL LOW (ref >90)
GFR calc Af Amer: >90 mL/min (ref >90)
GFR calc non Af Amer: 70 mL/min — ABNORMAL LOW (ref >90)
GFR calc non Af Amer: 78 mL/min — ABNORMAL LOW (ref >90)
Glucose, Bld: 105 mg/dL — ABNORMAL HIGH (ref 70–99)
Glucose, Bld: 94 mg/dL (ref 70–99)
POTASSIUM: 5.1 mmol/L (ref 3.5–5.1)
POTASSIUM: 3.2 mmol/L — AB (ref 3.5–5.1)
SODIUM: 141 mmol/L (ref 135–145)
Sodium: 143 mmol/L (ref 135–145)
TOTAL PROTEIN: 7.9 g/dL (ref 6.0–8.3)
TOTAL PROTEIN: 6.7 g/dL (ref 6.0–8.3)
Total Bilirubin: 2 mg/dL — ABNORMAL HIGH (ref 0.3–1.2)
Total Bilirubin: 0.6 mg/dL (ref 0.3–1.2)

## 2015-02-14 LAB — PROTIME-INR
INR: 1.13 (ref 0.00–1.49)
Prothrombin Time: 14.6 seconds (ref 11.6–15.2)

## 2015-02-14 LAB — URINE MICROSCOPIC-ADD ON

## 2015-02-14 LAB — I-STAT CG4 LACTIC ACID, ED
Lactic Acid, Venous: 2.31 mmol/L (ref 0.5–2.0)
Lactic Acid, Venous: 2.07 mmol/L (ref 0.5–2.0)

## 2015-02-14 LAB — TSH: TSH: 0.314 u[IU]/mL — ABNORMAL LOW (ref 0.350–4.500)

## 2015-02-14 LAB — MRSA PCR SCREENING: MRSA BY PCR: NEGATIVE

## 2015-02-14 LAB — PROCALCITONIN: PROCALCITONIN: 29.17 ng/mL

## 2015-02-14 LAB — LACTIC ACID, PLASMA
LACTIC ACID, VENOUS: 1.5 mmol/L (ref 0.5–2.0)
Lactic Acid, Venous: 1.3 mmol/L (ref 0.5–2.0)

## 2015-02-14 MED ORDER — IOHEXOL 300 MG/ML  SOLN
100.0000 mL | Freq: Once | INTRAMUSCULAR | Status: AC | PRN
  Administered 2015-02-14: 100 mL via INTRAVENOUS

## 2015-02-14 MED ORDER — PIPERACILLIN-TAZOBACTAM 3.375 G IVPB
3.3750 g | Freq: Three times a day (TID) | INTRAVENOUS | Status: DC
  Administered 2015-02-15 – 2015-02-17 (×8): 3.375 g via INTRAVENOUS
  Filled 2015-02-14 (×9): qty 50

## 2015-02-14 MED ORDER — ONDANSETRON HCL 4 MG PO TABS: 4.0000 mg | ORAL_TABLET | Freq: Four times a day (QID) | ORAL | Status: DC | PRN

## 2015-02-14 MED ORDER — ACETAMINOPHEN 325 MG PO TABS
650.0000 mg | ORAL_TABLET | Freq: Four times a day (QID) | ORAL | Status: DC | PRN
  Administered 2015-02-15: 650 mg via ORAL
  Filled 2015-02-14: qty 2

## 2015-02-14 MED ORDER — VANCOMYCIN HCL IN DEXTROSE 750-5 MG/150ML-% IV SOLN
750.0000 mg | Freq: Two times a day (BID) | INTRAVENOUS | Status: DC
  Administered 2015-02-15: 750 mg via INTRAVENOUS
  Filled 2015-02-14: qty 150

## 2015-02-14 MED ORDER — ACETAMINOPHEN 500 MG PO TABS
1000.0000 mg | ORAL_TABLET | Freq: Once | ORAL | Status: AC
  Administered 2015-02-14: 1000 mg via ORAL
  Filled 2015-02-14: qty 2

## 2015-02-14 MED ORDER — SODIUM CHLORIDE 0.9 % IV SOLN
INTRAVENOUS | Status: DC
  Administered 2015-02-14 – 2015-02-15 (×2): via INTRAVENOUS

## 2015-02-14 MED ORDER — DEXTROSE 5 % IV SOLN
1.0000 g | INTRAVENOUS | Status: DC
  Administered 2015-02-14: 1 g via INTRAVENOUS
  Filled 2015-02-14: qty 10

## 2015-02-14 MED ORDER — PIPERACILLIN-TAZOBACTAM 3.375 G IVPB 30 MIN
3.3750 g | Freq: Once | INTRAVENOUS | Status: AC
  Administered 2015-02-14: 3.375 g via INTRAVENOUS
  Filled 2015-02-14: qty 50

## 2015-02-14 MED ORDER — SODIUM CHLORIDE 0.9 % IV BOLUS (SEPSIS)
500.0000 mL | Freq: Once | INTRAVENOUS | Status: AC
  Administered 2015-02-14: 500 mL via INTRAVENOUS

## 2015-02-14 MED ORDER — SODIUM CHLORIDE 0.9 % IV BOLUS (SEPSIS)
1000.0000 mL | INTRAVENOUS | Status: AC
  Administered 2015-02-14 (×3): 1000 mL via INTRAVENOUS

## 2015-02-14 MED ORDER — ACETAMINOPHEN 500 MG PO TABS
1000.0000 mg | ORAL_TABLET | Freq: Four times a day (QID) | ORAL | Status: DC | PRN
  Administered 2015-02-14: 1000 mg via ORAL
  Filled 2015-02-14: qty 2

## 2015-02-14 MED ORDER — VANCOMYCIN HCL 10 G IV SOLR
2500.0000 mg | Freq: Once | INTRAVENOUS | Status: AC
  Administered 2015-02-14: 2500 mg via INTRAVENOUS
  Filled 2015-02-14: qty 2500

## 2015-02-14 MED ORDER — ONDANSETRON HCL 4 MG/2ML IJ SOLN: 4.0000 mg | Freq: Four times a day (QID) | INTRAMUSCULAR | Status: DC | PRN

## 2015-02-14 MED ORDER — GI COCKTAIL ~~LOC~~
30.0000 mL | Freq: Once | ORAL | Status: AC
  Administered 2015-02-14: 30 mL via ORAL
  Filled 2015-02-14: qty 30

## 2015-02-14 MED ORDER — ENOXAPARIN SODIUM 60 MG/0.6ML ~~LOC~~ SOLN
55.0000 mg | SUBCUTANEOUS | Status: DC
  Administered 2015-02-14 – 2015-02-16 (×3): 55 mg via SUBCUTANEOUS
  Filled 2015-02-14 (×4): qty 0.6

## 2015-02-14 MED ORDER — POTASSIUM CHLORIDE CRYS ER 20 MEQ PO TBCR
40.0000 meq | EXTENDED_RELEASE_TABLET | Freq: Once | ORAL | Status: AC
  Administered 2015-02-14: 40 meq via ORAL
  Filled 2015-02-14: qty 2

## 2015-02-14 MED ORDER — ACETAMINOPHEN 325 MG PO TABS: 650.0000 mg | ORAL_TABLET | ORAL | Status: DC | PRN

## 2015-02-14 MED ORDER — IBUPROFEN 800 MG PO TABS
800.0000 mg | ORAL_TABLET | Freq: Once | ORAL | Status: AC
  Administered 2015-02-14: 800 mg via ORAL
  Filled 2015-02-14: qty 1

## 2015-02-14 MED ORDER — ALBUTEROL SULFATE (2.5 MG/3ML) 0.083% IN NEBU: 2.5000 mg | INHALATION_SOLUTION | RESPIRATORY_TRACT | Status: DC | PRN

## 2015-02-14 MED ORDER — SODIUM CHLORIDE 0.9 % IV BOLUS (SEPSIS)
500.0000 mL | INTRAVENOUS | Status: AC
  Administered 2015-02-14: 500 mL via INTRAVENOUS

## 2015-02-14 MED ORDER — HYDROCODONE-ACETAMINOPHEN 5-325 MG PO TABS
1.0000 | ORAL_TABLET | ORAL | Status: DC | PRN
  Administered 2015-02-15 – 2015-02-16 (×3): 2 via ORAL
  Filled 2015-02-14 (×3): qty 2

## 2015-02-14 MED ORDER — VANCOMYCIN HCL IN DEXTROSE 1-5 GM/200ML-% IV SOLN: 1000.0000 mg | Freq: Once | INTRAVENOUS | Status: DC

## 2015-02-14 NOTE — ED Notes (Signed)
Bed: ON62WA12 Expected date:  Expected time:  Means of arrival:  Comments: EMS- 54yo M, UTI?

## 2015-02-14 NOTE — ED Provider Notes (Signed)
CSN: [id number]     Arrival date & time 02/14/15  4098 History   First MD Initiated Contact with Patient 02/14/15 1002     Chief Complaint  Patient presents with  . Fever     (Consider location/radiation/quality/duration/timing/severity/associated sxs/prior Treatment) HPI Comments: Patient here complaining of 2 days of right-sided flank pain with dysuria. Denies any penile drainage or discharge. Denies any cough congestion. Has had myalgias and chills. Took 2 Motrin prior to arrival. Has had nausea with nonbilious emesis 1. Denies any diarrhea. No headache or neck pain. No photophobia. Symptoms persistent and nothing makes them that or worse. Called EMS and was transported here  Patient is a 54 y.o. male presenting with fever. The history is provided by the patient.  Fever   Past Medical History  Diagnosis Date  . Hyperlipidemia   . Hypertension   . GERD (gastroesophageal reflux disease)    Past Surgical History  Procedure Laterality Date  . Varicocelectomy     Family History  Problem Relation Age of Onset  . Diabetes      FAM HX 1st degree relative  . Hyperlipidemia      FAM HX  . Hypertension      FAM HX  . Cancer Neg Hx   . Early death Neg Hx   . Hearing loss Neg Hx   . Heart disease Neg Hx   . Kidney disease Neg Hx   . Stroke Neg Hx   . Hypertension Mother   . Asthma Mother    History  Substance Use Topics  . Smoking status: Former Smoker    Quit date: 07/02/1986  . Smokeless tobacco: Not on file  . Alcohol Use: No    Review of Systems  Constitutional: Positive for fever.  All other systems reviewed and are negative.     Allergies  Review of patient's allergies indicates no known allergies.  Home Medications   Prior to Admission medications   Medication Sig Start Date End Date Taking? Authorizing Provider  Albuterol Sulfate (PROAIR RESPICLICK) 108 (90 BASE) MCG/ACT AEPB Inhale 1 puff into the lungs 4 (four) times daily as needed. 11/14/14   Etta Grandchild, MD  amLODipine (NORVASC) 5 MG tablet Take 1 tablet (5 mg total) by mouth daily. 08/10/14   Etta Grandchild, MD  azithromycin (ZITHROMAX) 500 MG tablet Take 1 tablet (500 mg total) by mouth daily. 11/14/14   Etta Grandchild, MD  HYDROcodone-homatropine [facility]) 5-1.5 MG/5ML syrup Take 5 mLs by mouth every 8 (eight) hours as needed for cough. 11/14/14   Etta Grandchild, MD  losartan (COZAAR) 100 MG tablet Take 1 tablet (100 mg total) by mouth daily. 08/10/14   Etta Grandchild, MD   BP 127/82 mmHg  Pulse 132  Temp(Src) 104.3 F (40.2 C) (Rectal)  Resp 20  SpO2 95% Physical Exam  Constitutional: He is oriented to person, place, and time. He appears well-developed and well-nourished.  Non-toxic appearance. No distress.  HENT:  Head: Normocephalic and atraumatic.  Eyes: Conjunctivae, EOM and lids are normal. Pupils are equal, round, and reactive to light.  Neck: Normal range of motion. Neck supple. No tracheal deviation present. No thyroid mass present.  Cardiovascular: Regular rhythm and normal heart sounds.  Tachycardia present.  Exam reveals no gallop.   No murmur heard. Pulmonary/Chest: Effort normal and breath sounds normal. No stridor. No respiratory distress. He has no decreased breath sounds. He has no wheezes. He has no rhonchi. He has no rales.  Abdominal: Soft. Normal appearance and bowel sounds are normal. He exhibits no distension. There is no tenderness. There is CVA tenderness. There is no rigidity, no rebound and no guarding.  Musculoskeletal: Normal range of motion. He exhibits no edema or tenderness.  Neurological: He is alert and oriented to person, place, and time. He has normal strength. No cranial nerve deficit or sensory deficit. GCS eye subscore is 4. GCS verbal subscore is 5. GCS motor subscore is 6.  Skin: Skin is warm and dry. No abrasion and no rash noted.  Psychiatric: He has a normal mood and affect. His speech is normal and behavior is normal.  Nursing note and  vitals reviewed.   ED Course  Procedures (including critical care time) Labs Review Labs Reviewed  CULTURE, BLOOD (ROUTINE X 2)  CULTURE, BLOOD (ROUTINE X 2)  URINE CULTURE  CBC WITH DIFFERENTIAL/PLATELET  COMPREHENSIVE METABOLIC PANEL  URINALYSIS, ROUTINE W REFLEX MICROSCOPIC  I-STAT CG4 LACTIC ACID, ED    Imaging Review No results found.   EKG Interpretation None      MDM   Final diagnoses:  None    Patient started on Rocephin for treatment of his urinary tract infection. Likely has polynephritis. Will be admitted to the medicine service    Toy BakerAnthony T Candon Caras, MD 02/14/15 1335

## 2015-02-14 NOTE — Progress Notes (Signed)
ANTIBIOTIC CONSULT NOTE - INITIAL  Pharmacy Consult for Vancomycin and zosyn Indication: sepsis  No Known Allergies  Patient Measurements: Weight: 240 lb (108.863 kg)  Vital Signs: Temp: 98.2 F (36.8 C) (03/03 1600) Temp Source: Oral (03/03 1600) BP: 123/71 mmHg (03/03 1400) Pulse Rate: 97 (03/03 1400) Intake/Output from previous day:   Intake/Output from this shift:    Labs:  Recent Labs  02/14/15 1017  WBC 9.5  HGB 13.6  PLT 207  CREATININE 1.16   Estimated Creatinine Clearance: 87.1 mL/min (by C-G formula based on Cr of 1.16). No results for input(s): VANCOTROUGH, VANCOPEAK, VANCORANDOM, GENTTROUGH, GENTPEAK, GENTRANDOM, TOBRATROUGH, TOBRAPEAK, TOBRARND, AMIKACINPEAK, AMIKACINTROU, AMIKACIN in the last 72 hours.   Microbiology: No results found for this or any previous visit (from the past 720 hour(s)).  Medical History: Past Medical History  Diagnosis Date  . Hyperlipidemia   . Hypertension   . GERD (gastroesophageal reflux disease)     Assessment: 54 y.o. male presenting with fever, complaining of 2 days of right-sided flank pain with dysuria.  Pharmacy consulted to dose vancomycin and zosyn for sepsis.  Goal of Therapy:  Vancomycin trough level 15-20 mcg/ml Vancomycin and zosyn per renal function  Plan:   Vancomycin 2500mg  IV x 1 load then 750mg  IV q12h per obesity nomegram  Zosyn 3.375mg  IV q8h EI  Follow renal function/cultures/clinical course  vanc trough as needed  Arley Phenixllen Marquitta Persichetti RPh 02/14/2015, 4:30 PM Pager [phone]

## 2015-02-14 NOTE — ED Notes (Signed)
Sudden onset of lower back pain with painful urination. Denies blood in urine. Pt ambulate to EMS stretcher then had sudden onset of N/V. Pt took tylenol 2 pills PO prior to EMS arrival.

## 2015-02-14 NOTE — H&P (Addendum)
[facility] History and Physical  Alexander Moses SWN:[id number] DOB: May 28, 1961 DOA: 02/14/2015  Referring physician: ED physician PCP: Scarlette Calico, MD   Chief Complaint: right flank area pain, fever  HPI:  Pt is 54 yo male with HTN, presented to [facility] ED with main concern of several days duration of progressively worsening right flank pain, constant and sharp, 7/10 in severity and occasionally but no consistently radiating to the lower abd quadrants, associated with fevers, chills, malaise, poor oral intake, nausea, no specific alleviating or aggravating factors. Pt reports some urinary urgency and frequency, mild dysuria. Pt denies similar episodes in the past, no known sick contacts or exposures. No chest pain or shortness of breath.   In ED, pt noted to be hemodynamically stable, VS notable for T 104F, HR in 130's, RR 26 bpm. UA suggestive of UTI. TRH asked to admit for further evaluation. SDU bed requested.  Assessment and Plan: Active Problems: Severe sepsis  - source appears to be UTI, ? Pyelonephritis - admit to ICU - criteria for sepsis met on admission with fever, tachycardia, RR in mid 20's, procalcitonin 29, elevated lactic acid 2.31 - place on broad spectrum ABX Vanc and Zosyn for now and narrow down as clinically indicated - provide IVF - ask for CT abd/pelvis w/c to rule out pyelo, check TSH  - repeat lactic acid per sepsis order set protocol  HTN - BP stable, hold home medical regimen for now and resume in AM if BP still stable  Transaminitis - ? Alcohol use, pt denies - will repeat CMET in AM DVT prophylaxis - Lovenox SQ  Radiological Exams on Admission: Dg Chest Port 1 View  02/14/2015   CLINICAL DATA:  Sudden onset low back pain with painful urination. Fever.  EXAM: PORTABLE CHEST - 1 VIEW  COMPARISON:  None.  FINDINGS: Cardiopericardial silhouette is mildly enlarged for projection, similar to prior. Mediastinal contours normal. Trachea midline. No airspace  disease or effusion. When compared to the prior study, today's exam is under penetrated. Probable calcified granuloma in the lateral LEFT mid lung.  IMPRESSION: Cardiomegaly without failure.   Electronically Signed   By: Dereck Ligas M.D.   On: 02/14/2015 10:52     Code Status: Full Family Communication: Pt at bedside Disposition Plan: Admit for further evaluation    [facility] [phone]   Review of Systems:  Constitutional: Negative for diaphoresis.  HENT: Negative for hearing loss, ear pain, nosebleeds, congestion, sore throat, neck pain, tinnitus and ear discharge.   Eyes: Negative for blurred vision, double vision, photophobia, pain, discharge and redness.  Respiratory: Negative for cough, hemoptysis, sputum production, shortness of breath, wheezing and stridor.   Cardiovascular: Negative for chest pain, palpitations, orthopnea, claudication and leg swelling.  Gastrointestinal: Per HPI Genitourinary: Negative for hematuria.  Musculoskeletal: Negative for myalgias, back pain, joint pain and falls.  Skin: Negative for itching and rash.  Neurological: Negative for dizziness and weakness.  Endo/Heme/Allergies: Negative for environmental allergies and polydipsia. Does not bruise/bleed easily.  Psychiatric/Behavioral: Negative for suicidal ideas. The patient is not nervous/anxious.      Past Medical History  Diagnosis Date  . Hyperlipidemia   . Hypertension   . GERD (gastroesophageal reflux disease)     Past Surgical History  Procedure Laterality Date  . Varicocelectomy      Social History:  reports that he quit smoking about 28 years ago. He does not have any smokeless tobacco history on file. He reports that he does not drink  alcohol or use illicit drugs.  No Known Allergies  Family History  Problem Relation Age of Onset  . Diabetes      FAM HX 1st degree relative  . Hyperlipidemia      FAM HX  . Hypertension      FAM HX  . Cancer Neg Hx   . Early death Neg  Hx   . Hearing loss Neg Hx   . Heart disease Neg Hx   . Kidney disease Neg Hx   . Stroke Neg Hx   . Hypertension Mother   . Asthma Mother     Prior to Admission medications   Medication Sig Start Date End Date Taking? Authorizing Provider  Albuterol Sulfate (PROAIR RESPICLICK) 168 (90 BASE) MCG/ACT AEPB Inhale 1 puff into the lungs 4 (four) times daily as needed. Patient taking differently: Inhale 1 puff into the lungs 2 (two) times daily as needed (for shortness of breath).  11/14/14  Yes Janith Lima, MD  amLODipine (NORVASC) 5 MG tablet Take 1 tablet (5 mg total) by mouth daily. 08/10/14  Yes Janith Lima, MD  losartan (COZAAR) 100 MG tablet Take 1 tablet (100 mg total) by mouth daily. 08/10/14  Yes Janith Lima, MD  Multiple Vitamin (MULTIVITAMIN WITH MINERALS) TABS tablet Take 2 tablets by mouth daily.   Yes Historical Provider, MD  naproxen sodium (ANAPROX) 220 MG tablet Take 440 mg by mouth every 12 (twelve) hours as needed (for pain).   Yes Historical Provider, MD  Omega-3 Fatty Acids (FISH OIL PO) Take 2 capsules by mouth daily.   Yes Historical Provider, MD  azithromycin (ZITHROMAX) 500 MG tablet Take 1 tablet (500 mg total) by mouth daily. Patient not taking: Reported on 02/14/2015 11/14/14   Janith Lima, MD  HYDROcodone-homatropine [facility]) 5-1.5 MG/5ML syrup Take 5 mLs by mouth every 8 (eight) hours as needed for cough. Patient not taking: Reported on 02/14/2015 11/14/14   Janith Lima, MD    Physical Exam: Filed Vitals:   02/14/15 1044 02/14/15 1048 02/14/15 1120 02/14/15 1157  BP: 137/83  131/71 131/71  Pulse: 125  118 116  Temp: 100.7 F (38.2 C)  101.7 F (38.7 C) 99.8 F (37.7 C)  TempSrc: Oral  Oral Oral  Resp: 14  30 21   Weight:  108.863 kg (240 lb)    SpO2: 95%  95% 96%    Physical Exam  Constitutional: Appears well-developed and well-nourished. No distress.  HENT: Normocephalic. External right and left ear normal. Dry MM Eyes: Conjunctivae and EOM  are normal. PERRLA, no scleral icterus.  Neck: Normal ROM. Neck supple. No JVD. No tracheal deviation. No thyromegaly.  CVS: Regular rhythm, tachycardic, S1/S2 +, no murmurs, no gallops, no carotid bruit.  Pulmonary: Effort and breath sounds normal, no stridor, rhonchi, wheezes, rales.  Abdominal: Soft. BS +,  no distension, tenderness in right flank area, no rebound or guarding.  Musculoskeletal: Normal range of motion. No edema and no tenderness.  Lymphadenopathy: No lymphadenopathy noted, cervical, inguinal. Neuro: Alert. Normal reflexes, muscle tone coordination. No cranial nerve deficit. Skin: Skin is warm and dry. No rash noted. Not diaphoretic. No erythema. No pallor.  Psychiatric: Normal mood and affect. Behavior, judgment, thought content normal.   Labs on Admission:  Basic Metabolic Panel:  Recent Labs Lab 02/14/15 1017  NA 141  K 5.1  CL 105  CO2 28  GLUCOSE 105*  BUN 12  CREATININE 1.16  CALCIUM 9.1   Liver Function Tests:  Recent Labs Lab 02/14/15 1017  AST 58*  ALT 31  ALKPHOS 106  BILITOT 2.0*  PROT 7.9  ALBUMIN 4.3   CBC:  Recent Labs Lab 02/14/15 1017  WBC 9.5  NEUTROABS 8.1*  HGB 13.6  HCT 40.7  MCV 82.6  PLT 207    EKG: pending   If 7PM-7AM, please contact night-coverage [email or web address] Password [email or web address] 02/14/2015, 1:52 PM

## 2015-02-15 DIAGNOSIS — A419 Sepsis, unspecified organism: Secondary | ICD-10-CM

## 2015-02-15 DIAGNOSIS — E876 Hypokalemia: Secondary | ICD-10-CM

## 2015-02-15 DIAGNOSIS — I10 Essential (primary) hypertension: Secondary | ICD-10-CM

## 2015-02-15 LAB — COMPREHENSIVE METABOLIC PANEL
ALBUMIN: 3.4 g/dL — AB (ref 3.5–5.2)
ALT: 62 U/L — ABNORMAL HIGH (ref 0–53)
AST: 55 U/L — ABNORMAL HIGH (ref 0–37)
Alkaline Phosphatase: 103 U/L (ref 39–117)
Anion gap: 6 (ref 5–15)
BUN: 11 mg/dL (ref 6–23)
CO2: 24 mmol/L (ref 19–32)
Calcium: 8.6 mg/dL (ref 8.4–10.5)
Chloride: 111 mmol/L (ref 96–112)
Creatinine, Ser: 1.16 mg/dL (ref 0.50–1.35)
GFR calc Af Amer: 81 mL/min — ABNORMAL LOW (ref >90)
GFR calc non Af Amer: 70 mL/min — ABNORMAL LOW (ref >90)
Glucose, Bld: 111 mg/dL — ABNORMAL HIGH (ref 70–99)
Potassium: 3.3 mmol/L — ABNORMAL LOW (ref 3.5–5.1)
SODIUM: 141 mmol/L (ref 135–145)
TOTAL PROTEIN: 6.4 g/dL (ref 6.0–8.3)
Total Bilirubin: 0.6 mg/dL (ref 0.3–1.2)

## 2015-02-15 LAB — CBC
HCT: 35.4 % — ABNORMAL LOW (ref 39.0–52.0)
Hemoglobin: 11.8 g/dL — ABNORMAL LOW (ref 13.0–17.0)
MCH: 27.8 pg (ref 26.0–34.0)
MCHC: 33.3 g/dL (ref 30.0–36.0)
MCV: 83.5 fL (ref 78.0–100.0)
PLATELETS: 170 10*3/uL (ref 150–400)
RBC: 4.24 MIL/uL (ref 4.22–5.81)
RDW: 16.1 % — ABNORMAL HIGH (ref 11.5–15.5)
WBC: 7.1 10*3/uL (ref 4.0–10.5)

## 2015-02-15 LAB — URINE CULTURE
CULTURE: NO GROWTH
Colony Count: NO GROWTH

## 2015-02-15 MED ORDER — AMLODIPINE BESYLATE 10 MG PO TABS
10.0000 mg | ORAL_TABLET | Freq: Every day | ORAL | Status: DC
  Administered 2015-02-15: 10 mg via ORAL
  Filled 2015-02-15: qty 1

## 2015-02-15 MED ORDER — POTASSIUM CHLORIDE CRYS ER 20 MEQ PO TBCR
40.0000 meq | EXTENDED_RELEASE_TABLET | Freq: Four times a day (QID) | ORAL | Status: AC
  Administered 2015-02-15 (×2): 40 meq via ORAL
  Filled 2015-02-15 (×2): qty 2

## 2015-02-15 NOTE — Progress Notes (Addendum)
[facility] reported to RN urine culture results of 2 anaerobic bottles with gram negative rods.

## 2015-02-15 NOTE — Progress Notes (Signed)
UR complete 

## 2015-02-15 NOTE — Progress Notes (Signed)
[facility] PROGRESS NOTE  Alexander Moses ZOX:[id number] DOB: 1961-08-18 DOA: 02/14/2015 PCP: Sanda Linger, MD  Assessment/Plan: 1. Sepsis -Secondary to urinary tract infection, possible polynephritis, evidence by temperature of 104, heart rates in the 130s, lactic acid of 2.31. -Since likely source of infection is urinary tract, we'll discontinue IV vancomycin and proceed with IV Zosyn -Follow-up on urine and blood cultures -Clinically improving this morning, lactic acid trended down to 1.5 -CT scan of abdomen and pelvis did not reveal hydronephrosis or hydroureter ureter. Kidney function stable.  2.  Hypokalemia. -Patient's potassium remains low this morning at 3.3, will replace with K Dur, repeat BMP in a.m.  3.  Elevated LFTs -A.m. CMP showing an AST of 55 with ALT of 62 -CT scan of abdomen and pelvis showed normal liver -Could be secondary to sepsis -Repeat CMP in a.m.  4.  Hypertension. -Systolic blood pressures fluctuating between 130 and 150 -Will restart Norvasc at 10 g by mouth daily  Code Status: Full code Family Communication:  Disposition Plan: Continue empiric IV antimicrobial therapy, follow-up on cultures, close monitoring in stepdown unit  Antibiotics:  Zosyn (started on 02/14/2015)  HPI/Subjective: Patient is a pleasant 54 year old gentleman with a past medical history of hypertension admitted to the medicine service on 02/14/2015 as he presented to the emergency department with complaints of generalized weakness, malaise, right-sided flank pain, as well as fevers and chills. He is found to be febrile with a temperature 104 having heart rate in the 130s. Urinalysis consistent with urinary tract infection. A CT scan of abdomen and pelvis showed findings suggestive of cystitis however there was no CT findings suggestive of acute pyelonephritis. He was started on empiric IV antimicrobial therapy with Zosyn and vancomycin.  Objective: Filed Vitals:   02/15/15  0600  BP: 148/89  Pulse: 79  Temp:   Resp: 19    Intake/Output Summary (Last 24 hours) at 02/15/15 0756 Last data filed at 02/15/15 0400  Gross per 24 hour  Intake 2208.75 ml  Output   2225 ml  Net -16.25 ml   Filed Weights   02/14/15 1048 02/14/15 1800 02/15/15 0500  Weight: 108.863 kg (240 lb) 114.1 kg (251 lb 8.7 oz) 116.5 kg (256 lb 13.4 oz)    Exam:   General:  Patient is awake, alert, mentating well, reports feeling better this morning  Cardiovascular: Regular rate and rhythm normal S1-S2 no murmurs rubs or gallops no extremity edema  Respiratory: Patient is breathing comfortable in room air, lungs clear to auscultation bilaterally no wheezing rhonchi rales  Abdomen: Having a soft abdomen, positive bowel sounds in all 4 quadrants, no rebound tenderness or guarding  Musculoskeletal: No edema  Data Reviewed: Basic Metabolic Panel:  Recent Labs Lab 02/14/15 1017 02/14/15 1730 02/15/15 0335  NA 141 143 141  K 5.1 3.2* 3.3*  CL 105 109 111  CO2 GLUCOSE 105* 94 111*  BUN CREATININE 1.16 1.06 1.16  CALCIUM 9.1 8.5 8.6   Liver Function Tests:  Recent Labs Lab 02/14/15 1017 02/14/15 1730 02/15/15 0335  AST 58* 29 55*  ALT 31 35 62*  ALKPHOS 106 91 103  BILITOT 2.0* 0.6 0.6  PROT 7.9 6.7 6.4  ALBUMIN 4.3 3.5 3.4*   No results for input(s): LIPASE, AMYLASE in the last 168 hours. No results for input(s): AMMONIA in the last 168 hours. CBC:  Recent Labs Lab 02/14/15 1017 02/14/15 1730 02/15/15 0335  WBC 9.5 11.2* 7.1  NEUTROABS 8.1* 9.8*  --   HGB 13.6 11.6* 11.8*  HCT 40.7 36.3* 35.4*  MCV 82.6 83.4 83.5  PLT 207 189 170   Cardiac Enzymes: No results for input(s): CKTOTAL, CKMB, CKMBINDEX, TROPONINI in the last 168 hours. BNP (last 3 results) No results for input(s): BNP in the last 8760 hours.  ProBNP (last 3 results) No results for input(s): PROBNP in the last 8760 hours.  CBG: No results for input(s): GLUCAP in  the last 168 hours.  Recent Results (from the past 240 hour(s))  Blood Culture (routine x 2)     Status: None (Preliminary result)   Collection Time: 02/14/15 10:17 AM  Result Value Ref Range Status   Specimen Description BLOOD LEFT HAND  Final   Special Requests BOTTLES DRAWN AEROBIC AND ANAEROBIC  Final   Culture   Final    GRAM NEGATIVE RODS Note: Gram Stain Report Called to,Read Back By and Verified With: SARA BETH EARLY AT 12:16 A.M. ON 02/15/2015 [facility] Performed at [facility]    Report Status PENDING  Incomplete  Blood Culture (routine x 2)     Status: None (Preliminary result)   Collection Time: 02/14/15 10:17 AM  Result Value Ref Range Status   Specimen Description BLOOD RIGHT ANTECUBITAL  Final   Special Requests BOTTLES DRAWN AEROBIC AND ANAEROBIC  Final   Culture   Final    GRAM NEGATIVE RODS Note: Gram Stain Report Called to,Read Back By and Verified With: SARA BETH EARLY AT 12:16 A.M. ON 02/15/2015 [facility] Performed at [facility]    Report Status PENDING  Incomplete  MRSA PCR Screening     Status: None   Collection Time: 02/14/15  4:54 PM  Result Value Ref Range Status   MRSA by PCR NEGATIVE NEGATIVE Final    Comment:        The GeneXpert MRSA Assay (FDA approved for NASAL specimens only), is one component of a comprehensive MRSA colonization surveillance program. It is not intended to diagnose MRSA infection nor to guide or monitor treatment for MRSA infections.      Studies: Ct Abdomen Pelvis W Contrast  02/14/2015   CLINICAL DATA:  Bilateral back pain, negative for hematuria, right flank pain  EXAM: CT ABDOMEN AND PELVIS WITH CONTRAST  TECHNIQUE: Multidetector CT imaging of the abdomen and pelvis was performed using the standard protocol following bolus administration of intravenous contrast.  CONTRAST:  OMNIPAQUE IOHEXOL 300 MG/ML  SOLN  COMPARISON:  None.  FINDINGS: The lung bases are clear.  The liver demonstrates no focal  abnormality. There is no intrahepatic or extrahepatic biliary ductal dilatation. The gallbladder is normal. The spleen demonstrates no focal abnormality.There is minimal bilateral perinephric stranding. 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 The left kidney, adrenal glands and pancreas are normal. There is bladder wall thickening.  The stomach, duodenum, small intestine, and large intestine demonstrate no bowel wall thickening or dilatation There is a normal caliber appendix in the right lower quadrant without periappendiceal inflammatory changes. There is no pneumoperitoneum, pneumatosis, or portal venous gas. There is no abdominal or pelvic free fluid. There is no lymphadenopathy.  The abdominal aorta is normal in caliber.  There are no lytic or sclerotic osseous lesions.  IMPRESSION: 1. There is relative bladder wall thickening which may be secondary to underdistention versus cystitis. 2. There are no CT findings of acute pyelonephritis.   Electronically  Signed   By: Elige KoHetal  Patel   On: 02/14/2015 20:51   Dg Chest Port 1 View  02/14/2015   CLINICAL DATA:  Sepsis  EXAM: PORTABLE CHEST - 1 VIEW  COMPARISON:  02/14/2015  FINDINGS: Cardiac shadow remains enlarged. No focal infiltrate or sizable effusion is seen. No bony abnormality is noted.  IMPRESSION: Stable cardiomegaly.  No acute abnormality is noted.   Electronically Signed   By: Alcide CleverMark  Lukens M.D.   On: 02/14/2015 17:37   Dg Chest Port 1 View  02/14/2015   CLINICAL DATA:  Sudden onset low back pain with painful urination. Fever.  EXAM: PORTABLE CHEST - 1 VIEW  COMPARISON:  None.  FINDINGS: Cardiopericardial silhouette is mildly enlarged for projection, similar to prior. Mediastinal contours normal. Trachea midline. No airspace disease or effusion. When compared to the prior study, today's exam is under penetrated. Probable calcified granuloma in the lateral LEFT mid lung.  IMPRESSION:  Cardiomegaly without failure.   Electronically Signed   By: Andreas NewportGeoffrey  Lamke M.D.   On: 02/14/2015 10:52    Scheduled Meds: . enoxaparin (LOVENOX) injection  55 mg Subcutaneous Q24H  . piperacillin-tazobactam (ZOSYN)  IV  3.375 g Intravenous Q8H  . potassium chloride  40 mEq Oral Q6H   Continuous Infusions: . sodium chloride 75 mL/hr at 02/15/15 0400    Principal Problem:   UTI (lower urinary tract infection) Active Problems:   Sepsis    Time spent: 35 minutes    Jeralyn BennettZAMORA, Krystyl Cannell  [facility] Pager [phone]. If 7PM-7AM, please contact night-coverage at [email or web address], password [email or web address] 02/15/2015, 7:56 AM  LOS: 1 day

## 2015-02-16 DIAGNOSIS — A4151 Sepsis due to Escherichia coli [E. coli]: Principal | ICD-10-CM

## 2015-02-16 LAB — URINE CULTURE: Colony Count: >=100000

## 2015-02-16 LAB — CBC
HCT: 37 % — ABNORMAL LOW (ref 39.0–52.0)
HEMOGLOBIN: 11.8 g/dL — AB (ref 13.0–17.0)
MCH: 26.3 pg (ref 26.0–34.0)
MCHC: 31.9 g/dL (ref 30.0–36.0)
MCV: 82.6 fL (ref 78.0–100.0)
PLATELETS: 161 10*3/uL (ref 150–400)
RBC: 4.48 MIL/uL (ref 4.22–5.81)
RDW: 16.2 % — ABNORMAL HIGH (ref 11.5–15.5)
WBC: 9.2 10*3/uL (ref 4.0–10.5)

## 2015-02-16 LAB — BASIC METABOLIC PANEL
Anion gap: 5 (ref 5–15)
BUN: 8 mg/dL (ref 6–23)
CHLORIDE: 109 mmol/L (ref 96–112)
CO2: 24 mmol/L (ref 19–32)
Calcium: 8.5 mg/dL (ref 8.4–10.5)
Creatinine, Ser: 1.1 mg/dL (ref 0.50–1.35)
GFR calc Af Amer: 86 mL/min — ABNORMAL LOW (ref >90)
GFR calc non Af Amer: 74 mL/min — ABNORMAL LOW (ref >90)
Glucose, Bld: 105 mg/dL — ABNORMAL HIGH (ref 70–99)
POTASSIUM: 3.6 mmol/L (ref 3.5–5.1)
Sodium: 138 mmol/L (ref 135–145)

## 2015-02-16 LAB — HEMOGLOBIN A1C
Hgb A1c MFr Bld: 6.1 % — ABNORMAL HIGH (ref 4.8–5.6)
Mean Plasma Glucose: 128 mg/dL

## 2015-02-16 MED ORDER — AMLODIPINE BESYLATE 5 MG PO TABS
5.0000 mg | ORAL_TABLET | Freq: Every day | ORAL | Status: DC
  Administered 2015-02-16 – 2015-02-17 (×2): 5 mg via ORAL
  Filled 2015-02-16 (×2): qty 1

## 2015-02-16 MED ORDER — DOCUSATE SODIUM 100 MG PO CAPS
100.0000 mg | ORAL_CAPSULE | Freq: Two times a day (BID) | ORAL | Status: DC
  Administered 2015-02-16 – 2015-02-17 (×3): 100 mg via ORAL
  Filled 2015-02-16 (×3): qty 1

## 2015-02-16 MED ORDER — POLYETHYLENE GLYCOL 3350 17 G PO PACK
17.0000 g | PACK | Freq: Every day | ORAL | Status: DC
  Administered 2015-02-16 – 2015-02-17 (×2): 17 g via ORAL
  Filled 2015-02-16 (×2): qty 1

## 2015-02-16 MED ORDER — LOSARTAN POTASSIUM 50 MG PO TABS
100.0000 mg | ORAL_TABLET | Freq: Every day | ORAL | Status: DC
  Administered 2015-02-16 – 2015-02-17 (×2): 100 mg via ORAL
  Filled 2015-02-16 (×2): qty 2

## 2015-02-16 MED ORDER — CALCIUM CARBONATE ANTACID 500 MG PO CHEW
1.0000 | CHEWABLE_TABLET | Freq: Four times a day (QID) | ORAL | Status: DC | PRN
  Administered 2015-02-16: 200 mg via ORAL
  Filled 2015-02-16: qty 1

## 2015-02-16 NOTE — Progress Notes (Signed)
[facility] PROGRESS NOTE  Alexander Moses ZOX:[id number] DOB: 1961-02-15 DOA: 02/14/2015 PCP: Sanda Linger, MD  Assessment/Plan: 1. Sepsis -Secondary to urinary tract infection, possible polynephritis, evidence by temperature of 104, heart rates in the 130s, lactic acid of 2.31, and positive blood cultures growing Escherichia coli 2 sets. -Since likely source of infection is urinary tract as both urine and blood cultures are growing Escherichia coli, susceptibility testing in progress..  -Will repeat blood cultures today to ensure sterility -CT scan of abdomen and pelvis did not reveal hydronephrosis or hydroureter ureter. Kidney function stable.  2.  Urinary tract infection. -Patient presenting with sepsis, urine cultures and blood cultures both growing Escherichia coli. -Susceptibility testing is in progress, currently on Zosyn. Will maintain patient on Zosyn therapy until cultures have been finalized.  3.  Hypokalemia. -Patient receiving potassium replacement with a.m. labs showing a potassium level of 3.6  3.  Elevated LFTs -A.m. CMP showing an AST of 55 with ALT of 62 -CT scan of abdomen and pelvis showed normal liver -Could be secondary to sepsis -Repeat CMP in a.m.  4.  Hypertension. -Systolic blood pressures fluctuating between 130 and 150 -Patient's Norvasc was restarted yesterday, will add lisinopril today for tighter blood pressure control  Code Status: Full code Family Communication:  Disposition Plan: Patient significantly improved, will transfer out of stepdown unit to MedSurg, follow-up on susceptibility testing of cultures  Antibiotics:  Zosyn (started on 02/14/2015)  HPI/Subjective: Patient is a pleasant 54 year old gentleman with a past medical history of hypertension admitted to the medicine service on 02/14/2015 as he presented to the emergency department with complaints of generalized weakness, malaise, right-sided flank pain, as well as fevers and  chills. He is found to be febrile with a temperature 104 having heart rate in the 130s. Urinalysis consistent with urinary tract infection. A CT scan of abdomen and pelvis showed findings suggestive of cystitis however there was no CT findings suggestive of acute pyelonephritis. He was started on empiric IV antimicrobial therapy with Zosyn and vancomycin.  Objective: Filed Vitals:   02/16/15 0904  BP: 147/79  Pulse:   Temp:   Resp:     Intake/Output Summary (Last 24 hours) at 02/16/15 0934 Last data filed at 02/16/15 0600  Gross per 24 hour  Intake   1990 ml  Output   2325 ml  Net   -335 ml   Filed Weights   02/14/15 1048 02/14/15 1800 02/15/15 0500  Weight: 108.863 kg (240 lb) 114.1 kg (251 lb 8.7 oz) 116.5 kg (256 lb 13.4 oz)    Exam:   General:  Patient is awake, alert, mentating well, reports feeling better this morning  Cardiovascular: Regular rate and rhythm normal S1-S2 no murmurs rubs or gallops no extremity edema  Respiratory: Patient is breathing comfortable in room air, lungs clear to auscultation bilaterally no wheezing rhonchi rales  Abdomen: Having a soft abdomen, positive bowel sounds in all 4 quadrants, no rebound tenderness or guarding  Musculoskeletal: No edema  Data Reviewed: Basic Metabolic Panel:  Recent Labs Lab 02/14/15 1017 02/14/15 1730 02/15/15 0335 02/16/15 0413  NA 141 143 141 138  K 5.1 3.2* 3.3* 3.6  CL 105 109 111 109  CO2 GLUCOSE 105* 94 111* 105*  BUN CREATININE 1.16 1.06 1.16 1.10  CALCIUM 9.1 8.5 8.6 8.5   Liver Function Tests:  Recent Labs Lab 02/14/15 1017 02/14/15 1730 02/15/15 0335  AST 58* 29 55*  ALT 31 35 62*  ALKPHOS 106 91 103  BILITOT 2.0* 0.6 0.6  PROT 7.9 6.7 6.4  ALBUMIN 4.3 3.5 3.4*   No results for input(s): LIPASE, AMYLASE in the last 168 hours. No results for input(s): AMMONIA in the last 168 hours. CBC:  Recent Labs Lab 02/14/15 1017 02/14/15 1730 02/15/15 0335  02/16/15 0413  WBC 9.5 11.2* 7.1 9.2  NEUTROABS 8.1* 9.8*  --   --   HGB 13.6 11.6* 11.8* 11.8*  HCT 40.7 36.3* 35.4* 37.0*  MCV 82.6 83.4 83.5 82.6  PLT 207 189 170 161   Cardiac Enzymes: No results for input(s): CKTOTAL, CKMB, CKMBINDEX, TROPONINI in the last 168 hours. BNP (last 3 results) No results for input(s): BNP in the last 8760 hours.  ProBNP (last 3 results) No results for input(s): PROBNP in the last 8760 hours.  CBG: No results for input(s): GLUCAP in the last 168 hours.  Recent Results (from the past 240 hour(s))  Blood Culture (routine x 2)     Status: None (Preliminary result)   Collection Time: 02/14/15 10:17 AM  Result Value Ref Range Status   Specimen Description BLOOD LEFT HAND  Final   Special Requests BOTTLES DRAWN AEROBIC AND ANAEROBIC  Final   Culture   Final    ESCHERICHIA COLI Note: Gram Stain Report Called to,Read Back By and Verified With: SARA BETH EARLY AT 12:16 A.M. ON 02/15/2015 [facility] Performed at [facility]    Report Status PENDING  Incomplete  Blood Culture (routine x 2)     Status: None (Preliminary result)   Collection Time: 02/14/15 10:17 AM  Result Value Ref Range Status   Specimen Description BLOOD RIGHT ANTECUBITAL  Final   Special Requests BOTTLES DRAWN AEROBIC AND ANAEROBIC  Final   Culture   Final    ESCHERICHIA COLI Note: Gram Stain Report Called to,Read Back By and Verified With: SARA BETH EARLY AT 12:16 A.M. ON 02/15/2015 WARR[facility] Performed at [facility]    Report Status PENDING  Incomplete  Urine culture     Status: None (Preliminary result)   Collection Time: 02/14/15 11:50 AM  Result Value Ref Range Status   Specimen Description URINE, CLEAN CATCH  Final   Special Requests NONE  Final   Colony Count   Final    >=100,000 COLONIES/ML Performed at [facility]    Culture   Final    ESCHERICHIA COLI Performed at [facility]    Report Status PENDING  Incomplete  MRSA PCR  Screening     Status: None   Collection Time: 02/14/15  4:54 PM  Result Value Ref Range Status   MRSA by PCR NEGATIVE NEGATIVE Final    Comment:        The GeneXpert MRSA Assay (FDA approved for NASAL specimens only), is one component of a comprehensive MRSA colonization surveillance program. It is not intended to diagnose MRSA infection nor to guide or monitor treatment for MRSA infections.   Urine culture     Status: None   Collection Time: 02/14/15  5:42 PM  Result Value Ref Range Status   Specimen Description URINE, CLEAN CATCH  Final   Special Requests NONE  Final   Colony Count NO GROWTH Performed at [facility]   Final   Culture NO GROWTH Performed at [facility]   Final   Report Status 02/15/2015 FINAL  Final     Studies: Ct Abdomen Pelvis W Contrast  02/14/2015   CLINICAL DATA:  Bilateral back pain, negative for hematuria, right flank pain  EXAM: CT ABDOMEN AND PELVIS WITH CONTRAST  TECHNIQUE: Multidetector CT imaging of the abdomen and pelvis was performed using the standard protocol following bolus administration of intravenous contrast.  CONTRAST:  100mL OMNIPAQUE IOHEXOL 300 MG/ML  SOLN  COMPARISON:  None.  FINDINGS: The lung bases are clear.  The liver demonstrates no focal abnormality. There is no intrahepatic or extrahepatic biliary ductal dilatation. The gallbladder is normal. The spleen demonstrates no focal abnormality.There is minimal bilateral perinephric stranding. 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 The left kidney, adrenal glands and pancreas are normal. There is bladder wall thickening.  The stomach, duodenum, small intestine, and large intestine demonstrate no bowel wall thickening or dilatation There is a normal caliber appendix in the right lower quadrant without periappendiceal inflammatory changes. There is no pneumoperitoneum, pneumatosis, or portal venous gas.  There is no abdominal or pelvic free fluid. There is no lymphadenopathy.  The abdominal aorta is normal in caliber.  There are no lytic or sclerotic osseous lesions.  IMPRESSION: 1. There is relative bladder wall thickening which may be secondary to underdistention versus cystitis. 2. There are no CT findings of acute pyelonephritis.   Electronically Signed   By: Elige KoHetal  Patel   On: 02/14/2015 20:51   Dg Chest Port 1 View  02/14/2015   CLINICAL DATA:  Sepsis  EXAM: PORTABLE CHEST - 1 VIEW  COMPARISON:  02/14/2015  FINDINGS: Cardiac shadow remains enlarged. No focal infiltrate or sizable effusion is seen. No bony abnormality is noted.  IMPRESSION: Stable cardiomegaly.  No acute abnormality is noted.   Electronically Signed   By: Alcide CleverMark  Lukens M.D.   On: 02/14/2015 17:37   Dg Chest Port 1 View  02/14/2015   CLINICAL DATA:  Sudden onset low back pain with painful urination. Fever.  EXAM: PORTABLE CHEST - 1 VIEW  COMPARISON:  None.  FINDINGS: Cardiopericardial silhouette is mildly enlarged for projection, similar to prior. Mediastinal contours normal. Trachea midline. No airspace disease or effusion. When compared to the prior study, today's exam is under penetrated. Probable calcified granuloma in the lateral LEFT mid lung.  IMPRESSION: Cardiomegaly without failure.   Electronically Signed   By: Andreas NewportGeoffrey  Lamke M.D.   On: 02/14/2015 10:52    Scheduled Meds: . amLODipine  5 mg Oral Daily  . enoxaparin (LOVENOX) injection  55 mg Subcutaneous Q24H  . losartan  100 mg Oral Daily  . piperacillin-tazobactam (ZOSYN)  IV  3.375 g Intravenous Q8H   Continuous Infusions:    Principal Problem:   UTI (lower urinary tract infection) Active Problems:   Sepsis    Time spent: 35 minutes    Jeralyn BennettZAMORA, Alexander Moses  [facility] Pager [phone]. If 7PM-7AM, please contact night-coverage at [email or web address], password [email or web address] 02/16/2015, 9:34 AM  LOS: 2 days

## 2015-02-17 LAB — CULTURE, BLOOD (ROUTINE X 2)

## 2015-02-17 LAB — CBC
HCT: 36.1 % — ABNORMAL LOW (ref 39.0–52.0)
Hemoglobin: 11.8 g/dL — ABNORMAL LOW (ref 13.0–17.0)
MCH: 26.8 pg (ref 26.0–34.0)
MCHC: 32.7 g/dL (ref 30.0–36.0)
MCV: 81.9 fL (ref 78.0–100.0)
Platelets: 195 10*3/uL (ref 150–400)
RBC: 4.41 MIL/uL (ref 4.22–5.81)
RDW: 16.2 % — ABNORMAL HIGH (ref 11.5–15.5)
WBC: 7.3 10*3/uL (ref 4.0–10.5)

## 2015-02-17 LAB — BASIC METABOLIC PANEL
Anion gap: 10 (ref 5–15)
BUN: 8 mg/dL (ref 6–23)
CO2: 25 mmol/L (ref 19–32)
CREATININE: 1.05 mg/dL (ref 0.50–1.35)
Calcium: 9.2 mg/dL (ref 8.4–10.5)
Chloride: 106 mmol/L (ref 96–112)
GFR calc Af Amer: >90 mL/min (ref >90)
GFR calc non Af Amer: 79 mL/min — ABNORMAL LOW (ref >90)
Glucose, Bld: 98 mg/dL (ref 70–99)
Potassium: 3.7 mmol/L (ref 3.5–5.1)
Sodium: 141 mmol/L (ref 135–145)

## 2015-02-17 MED ORDER — CIPROFLOXACIN HCL 500 MG PO TABS: 500.0000 mg | ORAL_TABLET | Freq: Two times a day (BID) | ORAL | Status: DC

## 2015-02-17 NOTE — Discharge Summary (Signed)
Physician Discharge Summary  Alexander Moses:[id number] DOB: June 03, 1961 DOA: 02/14/2015  PCP: Alexander Linger, MD  Admit date: 02/14/2015 Discharge date: 02/17/2015  Time spent: 35 minutes  Recommendations for Outpatient Follow-up:  1. Patient having positive blood cultures, growing Escherichia coli. Repeat blood cultures were obtained on 02/16/2015 showing no growth after overnight incubation. Please follow-up on these blood cultures on hospital follow-up visit 2. Repeat CMP on hospital follow-up visit, patient having elevated transaminases, AST of 55 ALT of 62 like they're related to sepsis   Discharge Diagnoses:  Principal Problem:   Sepsis Active Problems:   UTI (lower urinary tract infection)   Essential hypertension   Discharge Condition: Stable  Diet recommendation: Heart healthy  Filed Weights   02/14/15 1800 02/15/15 0500 02/16/15 1215  Weight: 114.1 kg (251 lb 8.7 oz) 116.5 kg (256 lb 13.4 oz) 117.119 kg (258 lb 3.2 oz)    History of present illness:  Pt is 54 yo male with HTN, presented to [facility] ED with main concern of several days duration of progressively worsening right flank pain, constant and sharp, 7/10 in severity and occasionally but no consistently radiating to the lower abd quadrants, associated with fevers, chills, malaise, poor oral intake, nausea, no specific alleviating or aggravating factors. Pt reports some urinary urgency and frequency, mild dysuria. Pt denies similar episodes in the past, no known sick contacts or exposures. No chest pain or shortness of breath.   Hospital Course:  Patient is a pleasant 54 year old gentleman with a past medical history of hypertension admitted to the medicine service on 02/14/2015 as he presented to the emergency department with complaints of generalized weakness, malaise, right-sided flank pain, as well as fevers and chills. He is found to be febrile with a temperature 104 having heart rate in the 130s. Urinalysis consistent with  urinary tract infection. A CT scan of abdomen and pelvis showed findings suggestive of cystitis however there was no CT findings suggestive of acute pyelonephritis. He was started on empiric IV antimicrobial therapy with Zosyn and vancomycin.  1. Sepsis -Secondary to urinary tract infection, possible polynephritis, evidence by temperature of 104, heart rates in the 130s, lactic acid of 2.31, and positive blood cultures growing Escherichia coli 2 sets. -CT scan of abdomen and pelvis did not reveal hydronephrosis or hydroureter ureter. Kidney function stable. -Since likely source of infection is urinary tract as both urine and blood cultures are growing Escherichia coli, susceptibility testing showing organism pan susceptible  -Will discharge on ciprofloxacin 500 mg by mouth twice a day -Follow-up on repeat blood cultures to ensure sterility  2. Urinary tract infection. -Patient presenting with sepsis, urine cultures and blood cultures both growing Escherichia coli. -Urine cultures growing Escherichia coli, pan susceptible. Will discharge patient on ciprofloxacin 500 mg by mouth twice a day   3. Elevated LFTs -A.m. CMP showing an AST of 55 with ALT of 62 -CT scan of abdomen and pelvis showed normal liver -Could be secondary to sepsis -Repeat CMP on hospital follow-up visit.  4. Hypertension. -Resume Cozaar and amlodipine on discharge    Discharge Exam: Filed Vitals:   02/17/15 0219  BP: 117/80  Pulse: 74  Temp: 98 F (36.7 C)  Resp: 16    General: No acute distress, nontoxic-appearing, reports feeling ready to be discharged today Cardiovascular: Regular rate and rhythm normal S1-S2 no murmurs or gallops Respiratory: Normal respiratory effort, lungs are clear to auscultation Abdomen: Soft nontender nondistended  Discharge Instructions   Discharge Instructions  Call MD for:  difficulty breathing, headache or visual disturbances    Complete by:  As directed      Call  MD for:  extreme fatigue    Complete by:  As directed      Call MD for:  hives    Complete by:  As directed      Call MD for:  persistant dizziness or light-headedness    Complete by:  As directed      Call MD for:  persistant nausea and vomiting    Complete by:  As directed      Call MD for:  redness, tenderness, or signs of infection (pain, swelling, redness, odor or green/yellow discharge around incision site)    Complete by:  As directed      Call MD for:  severe uncontrolled pain    Complete by:  As directed      Call MD for:  temperature >100.4    Complete by:  As directed      Diet - low sodium heart healthy    Complete by:  As directed      Increase activity slowly    Complete by:  As directed           Current Discharge Medication List    START taking these medications   Details  ciprofloxacin (CIPRO) 500 MG tablet Take 1 tablet (500 mg total) by mouth 2 (two) times daily. Qty: 18 tablet, Refills: 0      CONTINUE these medications which have NOT CHANGED   Details  Albuterol Sulfate (PROAIR RESPICLICK) 108 (90 BASE) MCG/ACT AEPB Inhale 1 puff into the lungs 4 (four) times daily as needed. Qty: 1 each, Refills: 3   Associated Diagnoses: Bronchitis with asthma, acute    amLODipine (NORVASC) 5 MG tablet Take 1 tablet (5 mg total) by mouth daily. Qty: 90 tablet, Refills: 3   Associated Diagnoses: Unspecified essential hypertension    losartan (COZAAR) 100 MG tablet Take 1 tablet (100 mg total) by mouth daily. Qty: 90 tablet, Refills: 3   Associated Diagnoses: Unspecified essential hypertension    Multiple Vitamin (MULTIVITAMIN WITH MINERALS) TABS tablet Take 2 tablets by mouth daily.    naproxen sodium (ANAPROX) 220 MG tablet Take 440 mg by mouth every 12 (twelve) hours as needed (for pain).    Omega-3 Fatty Acids (FISH OIL PO) Take 2 capsules by mouth daily.    azithromycin (ZITHROMAX) 500 MG tablet Take 1 tablet (500 mg total) by mouth daily. Qty: 3 tablet,  Refills: 0   Associated Diagnoses: Bronchitis with asthma, acute    HYDROcodone-homatropine (HYCODAN) 5-1.5 MG/5ML syrup Take 5 mLs by mouth every 8 (eight) hours as needed for cough. Qty: 120 mL, Refills: 0   Associated Diagnoses: Cough; Bronchitis with asthma, acute       No Known Allergies Follow-up Information    Follow up with Alexander Linger, MD In 1 week.   Specialty:  Internal Medicine   Contact information:   [address] [place] [address] [phone]        The results of significant diagnostics from this hospitalization (including imaging, microbiology, ancillary and laboratory) are listed below for reference.    Significant Diagnostic Studies: Ct Abdomen Pelvis W Contrast  02/14/2015   CLINICAL DATA:  Bilateral back pain, negative for hematuria, right flank pain  EXAM: CT ABDOMEN AND PELVIS WITH CONTRAST  TECHNIQUE: Multidetector CT imaging of the abdomen and pelvis was performed using the standard  protocol following bolus administration of intravenous contrast.  CONTRAST:  OMNIPAQUE IOHEXOL 300 MG/ML  SOLN  COMPARISON:  None.  FINDINGS: The lung bases are clear.  The liver demonstrates no focal abnormality. There is no intrahepatic or extrahepatic biliary ductal dilatation. The gallbladder is normal. The spleen demonstrates no focal abnormality.There is minimal bilateral perinephric stranding. 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 The left kidney, adrenal glands and pancreas are normal. There is bladder wall thickening.  The stomach, duodenum, small intestine, and large intestine demonstrate no bowel wall thickening or dilatation There is a normal caliber appendix in the right lower quadrant without periappendiceal inflammatory changes. There is no pneumoperitoneum, pneumatosis, or portal venous gas. There is no abdominal or pelvic free fluid. There is no lymphadenopathy.  The  abdominal aorta is normal in caliber.  There are no lytic or sclerotic osseous lesions.  IMPRESSION: 1. There is relative bladder wall thickening which may be secondary to underdistention versus cystitis. 2. There are no CT findings of acute pyelonephritis.   Electronically Signed   By: Elige Ko   On: 02/14/2015 20:51   Dg Chest Port 1 View  02/14/2015   CLINICAL DATA:  Sepsis  EXAM: PORTABLE CHEST - 1 VIEW  COMPARISON:  02/14/2015  FINDINGS: Cardiac shadow remains enlarged. No focal infiltrate or sizable effusion is seen. No bony abnormality is noted.  IMPRESSION: Stable cardiomegaly.  No acute abnormality is noted.   Electronically Signed   By: Alcide Clever M.D.   On: 02/14/2015 17:37   Dg Chest Port 1 View  02/14/2015   CLINICAL DATA:  Sudden onset low back pain with painful urination. Fever.  EXAM: PORTABLE CHEST - 1 VIEW  COMPARISON:  None.  FINDINGS: Cardiopericardial silhouette is mildly enlarged for projection, similar to prior. Mediastinal contours normal. Trachea midline. No airspace disease or effusion. When compared to the prior study, today's exam is under penetrated. Probable calcified granuloma in the lateral LEFT mid lung.  IMPRESSION: Cardiomegaly without failure.   Electronically Signed   By: Andreas Newport M.D.   On: 02/14/2015 10:52    Microbiology: Recent Results (from the past 240 hour(s))  Blood Culture (routine x 2)     Status: None   Collection Time: 02/14/15 10:17 AM  Result Value Ref Range Status   Specimen Description BLOOD LEFT HAND  Final   Special Requests BOTTLES DRAWN AEROBIC AND ANAEROBIC  Final   Culture   Final    ESCHERICHIA COLI Note: SUSCEPTIBILITIES PERFORMED ON PREVIOUS CULTURE WITHIN THE LAST 5 DAYS. Note: Gram Stain Report Called to,Read Back By and Verified With: SARA BETH EARLY AT 12:16 A.M. ON 02/15/2015 WARRB Performed at [facility]    Report Status 02/17/2015 FINAL  Final  Blood Culture (routine x 2)     Status: None   Collection  Time: 02/14/15 10:17 AM  Result Value Ref Range Status   Specimen Description BLOOD RIGHT ANTECUBITAL  Final   Special Requests BOTTLES DRAWN AEROBIC AND ANAEROBIC  Final   Culture   Final    ESCHERICHIA COLI Note: Gram Stain Report Called to,Read Back By and Verified With: SARA BETH EARLY AT 12:16 A.M. ON 02/15/2015 WARRB Performed at [facility]    Report Status 02/17/2015 FINAL  Final   Organism ID, Bacteria ESCHERICHIA COLI  Final      Susceptibility   Escherichia coli - MIC*  AMPICILLIN <=2 SENSITIVE Sensitive     AMPICILLIN/SULBACTAM <=2 SENSITIVE Sensitive     CEFAZOLIN <=4 SENSITIVE Sensitive     CEFEPIME <=1 SENSITIVE Sensitive     CEFTAZIDIME <=1 SENSITIVE Sensitive     CEFTRIAXONE <=1 SENSITIVE Sensitive     CIPROFLOXACIN <=0.25 SENSITIVE Sensitive     GENTAMICIN <=1 SENSITIVE Sensitive     IMIPENEM <=0.25 SENSITIVE Sensitive     PIP/TAZO <=4 SENSITIVE Sensitive     TOBRAMYCIN <=1 SENSITIVE Sensitive     TRIMETH/SULFA <=20 SENSITIVE Sensitive     * ESCHERICHIA COLI  Urine culture     Status: None   Collection Time: 02/14/15 11:50 AM  Result Value Ref Range Status   Specimen Description URINE, CLEAN CATCH  Final   Special Requests NONE  Final   Colony Count   Final    >=100,000 COLONIES/ML [facility]    Culture   Final    ESCHERICHIA COLI Performed at [facility]    Report Status 02/16/2015 FINAL  Final   Organism ID, Bacteria ESCHERICHIA COLI  Final      Susceptibility   Escherichia coli - MIC*    AMPICILLIN <=2 SENSITIVE Sensitive     CEFAZOLIN <=4 SENSITIVE Sensitive     CEFTRIAXONE <=1 SENSITIVE Sensitive     CIPROFLOXACIN <=0.25 SENSITIVE Sensitive     GENTAMICIN <=1 SENSITIVE Sensitive     LEVOFLOXACIN <=0.12 SENSITIVE Sensitive     NITROFURANTOIN <=16 SENSITIVE Sensitive     TOBRAMYCIN <=1 SENSITIVE Sensitive     TRIMETH/SULFA <=20 SENSITIVE Sensitive     PIP/TAZO <=4 SENSITIVE Sensitive     *  ESCHERICHIA COLI  MRSA PCR Screening     Status: None   Collection Time: 02/14/15  4:54 PM  Result Value Ref Range Status   MRSA by PCR NEGATIVE NEGATIVE Final    Comment:        The GeneXpert MRSA Assay (FDA approved for NASAL specimens only), is one component of a comprehensive MRSA colonization surveillance program. It is not intended to diagnose MRSA infection nor to guide or monitor treatment for MRSA infections.   Urine culture     Status: None   Collection Time: 02/14/15  5:42 PM  Result Value Ref Range Status   Specimen Description URINE, CLEAN CATCH  Final   Special Requests NONE  Final   Colony Count NO GROWTH [facility]formed at [facility]   Final   Culture NO GROWTH [facility] at [facility]   Final   Report Status 02/15/2015 FINAL  Final  Culture, blood (routine x 2)     Status: None (Preliminary result)   Collection Time: 02/16/15  9:55 AM  Result Value Ref Range Status   Specimen Description BLOOD RIGHT ARM  Final   Special Requests   Final    BOTTLES DRAWN AEROBIC AND ANAEROBIC 10CC BOTH BOTTLES   Culture   Final           BLOOD CULTURE RECEIVED NO GROWTH TO DATE CULTURE WILL BE HELD FOR 5 DAYS BEFORE ISSUING A FINAL NEGATIVE REPORT Performed at [facility]    Report Status PENDING  Incomplete  Culture, blood (routine x 2)     Status: None (Preliminary result)   Collection Time: 02/16/15 10:04 AM  Result Value Ref Range Status   Specimen Description BLOOD LEFT HAND  Final   Special Requests   Final    BOTTLES DRAWN AEROBIC AND ANAEROBIC 10CC BOTH  BOTTLES   Culture   Final           BLOOD CULTURE RECEIVED NO GROWTH TO DATE CULTURE WILL BE HELD FOR 5 DAYS BEFORE ISSUING A FINAL NEGATIVE REPORT Performed at [facility]    Report Status PENDING  Incomplete     Labs: Basic Metabolic Panel:  Recent Labs Lab 02/14/15 1017 02/14/15 1730 02/15/15 0335 02/16/15 0413 02/17/15 0443  NA 141 143 141 138 141  K 5.1 3.2* 3.3*  3.6 3.7  CL 105 109 111 109 106  CO2 28 28 24 24 25   GLUCOSE 105* 94 111* 105* 98  BUN 12 10 11 8 8   CREATININE 1.16 1.06 1.16 1.10 1.05  CALCIUM 9.1 8.5 8.6 8.5 9.2   Liver Function Tests:  Recent Labs Lab 02/14/15 1017 02/14/15 1730 02/15/15 0335  AST 58* 29 55*  ALT 31 35 62*  ALKPHOS 106 91 103  BILITOT 2.0* 0.6 0.6  PROT 7.9 6.7 6.4  ALBUMIN 4.3 3.5 3.4*   No results for input(s): LIPASE, AMYLASE in the last 168 hours. No results for input(s): AMMONIA in the last 168 hours. CBC:  Recent Labs Lab 02/14/15 1017 02/14/15 1730 02/15/15 0335 02/16/15 0413 02/17/15 0443  WBC 9.5 11.2* 7.1 9.2 7.3  NEUTROABS 8.1* 9.8*  --   --   --   HGB 13.6 11.6* 11.8* 11.8* 11.8*  HCT 40.7 36.3* 35.4* 37.0* 36.1*  MCV 82.6 83.4 83.5 82.6 81.9  PLT 207 189 170 161 195   Cardiac Enzymes: No results for input(s): CKTOTAL, CKMB, CKMBINDEX, TROPONINI in the last 168 hours. BNP: BNP (last 3 results) No results for input(s): BNP in the last 8760 hours.  ProBNP (last 3 results) No results for input(s): PROBNP in the last 8760 hours.  CBG: No results for input(s): GLUCAP in the last 168 hours.     SignedJeralyn Bennett  [facility] 02/17/2015, 9:19 AM

## 2015-02-17 NOTE — Plan of Care (Signed)
Problem: Discharge Progression Outcomes Goal: Discharge plan in place and appropriate Outcome: Completed/Met Date Met:  02/17/15 Discharge to wife.

## 2015-02-17 NOTE — Plan of Care (Signed)
Problem: Discharge Progression Outcomes Goal: Discharge plan in place and appropriate Outcome: Completed/Met Date Met:  02/17/15 Discharged to wife. Discharge instructions explained to pt. Script given for Cipro     

## 2015-02-22 ENCOUNTER — Inpatient Hospital Stay: Payer: 59 | Admitting: Internal Medicine

## 2015-02-22 LAB — CULTURE, BLOOD (ROUTINE X 2)
Culture: NO GROWTH
Culture: NO GROWTH

## 2015-03-07 ENCOUNTER — Encounter: Payer: Self-pay | Admitting: Internal Medicine

## 2015-03-07 ENCOUNTER — Ambulatory Visit (INDEPENDENT_AMBULATORY_CARE_PROVIDER_SITE_OTHER): Payer: 59 | Admitting: Internal Medicine

## 2015-03-07 ENCOUNTER — Other Ambulatory Visit (INDEPENDENT_AMBULATORY_CARE_PROVIDER_SITE_OTHER): Payer: 59

## 2015-03-07 VITALS — BP 140/80 | HR 64 | Temp 98.7°F | Resp 16 | Ht 68.0 in | Wt 247.0 lb

## 2015-03-07 DIAGNOSIS — I10 Essential (primary) hypertension: Secondary | ICD-10-CM | POA: Diagnosis not present

## 2015-03-07 DIAGNOSIS — N410 Acute prostatitis: Secondary | ICD-10-CM

## 2015-03-07 DIAGNOSIS — N390 Urinary tract infection, site not specified: Secondary | ICD-10-CM | POA: Diagnosis not present

## 2015-03-07 LAB — COMPREHENSIVE METABOLIC PANEL
ALK PHOS: 109 U/L (ref 39–117)
ALT: 21 U/L (ref 0–53)
AST: 17 U/L (ref 0–37)
Albumin: 4.1 g/dL (ref 3.5–5.2)
BILIRUBIN TOTAL: 0.3 mg/dL (ref 0.2–1.2)
BUN: 8 mg/dL (ref 6–23)
CO2: 29 meq/L (ref 19–32)
Calcium: 10 mg/dL (ref 8.4–10.5)
Chloride: 107 mEq/L (ref 96–112)
Creatinine, Ser: 1.15 mg/dL (ref 0.40–1.50)
GFR: 85.05 mL/min (ref >60.00)
Glucose, Bld: 90 mg/dL (ref 70–99)
Potassium: 4.2 mEq/L (ref 3.5–5.1)
Sodium: 142 mEq/L (ref 135–145)
Total Protein: 7.1 g/dL (ref 6.0–8.3)

## 2015-03-07 LAB — CBC WITH DIFFERENTIAL/PLATELET
BASOS ABS: 0 10*3/uL (ref 0.0–0.1)
Basophils Relative: 0.6 % (ref 0.0–3.0)
EOS PCT: 1.2 % (ref 0.0–5.0)
Eosinophils Absolute: 0.1 10*3/uL (ref 0.0–0.7)
HCT: 39 % (ref 39.0–52.0)
Hemoglobin: 12.8 g/dL — ABNORMAL LOW (ref 13.0–17.0)
LYMPHS PCT: 54.2 % — AB (ref 12.0–46.0)
Lymphs Abs: 3 10*3/uL (ref 0.7–4.0)
MCHC: 32.9 g/dL (ref 30.0–36.0)
MCV: 81.5 fl (ref 78.0–100.0)
MONOS PCT: 6.3 % (ref 3.0–12.0)
Monocytes Absolute: 0.3 10*3/uL (ref 0.1–1.0)
NEUTROS PCT: 37.7 % — AB (ref 43.0–77.0)
Neutro Abs: 2.1 10*3/uL (ref 1.4–7.7)
PLATELETS: 310 10*3/uL (ref 150.0–400.0)
RBC: 4.79 Mil/uL (ref 4.22–5.81)
RDW: 16.5 % — AB (ref 11.5–15.5)
WBC: 5.5 10*3/uL (ref 4.0–10.5)

## 2015-03-07 LAB — URINALYSIS, ROUTINE W REFLEX MICROSCOPIC
Bilirubin Urine: NEGATIVE
Hgb urine dipstick: NEGATIVE
KETONES UR: NEGATIVE
Leukocytes, UA: NEGATIVE
NITRITE: NEGATIVE
SPECIFIC GRAVITY, URINE: 1.02 (ref 1.000–1.030)
Total Protein, Urine: NEGATIVE
UROBILINOGEN UA: 0.2 (ref 0.0–1.0)
Urine Glucose: NEGATIVE
pH: 5.5 (ref 5.0–8.0)

## 2015-03-07 LAB — PSA: PSA: 3.9 ng/mL (ref 0.10–4.00)

## 2015-03-07 LAB — TSH: TSH: 0.93 u[IU]/mL (ref 0.35–4.50)

## 2015-03-07 MED ORDER — CIPROFLOXACIN HCL 500 MG PO TABS: 500.0000 mg | ORAL_TABLET | Freq: Two times a day (BID) | ORAL | Status: DC

## 2015-03-07 NOTE — Progress Notes (Signed)
Subjective:    Patient ID: Alexander Moses, male    DOB: 08/28/1961, 54 y.o.   MRN: [id number]  Hypertension This is a chronic problem. The current episode started more than 1 year ago. The problem has been gradually improving since onset. The problem is controlled. Pertinent negatives include no anxiety, blurred vision, chest pain, headaches, malaise/fatigue, neck pain, orthopnea, palpitations, peripheral edema, PND, shortness of breath or sweats. There are no associated agents to hypertension. Past treatments include angiotensin blockers and calcium channel blockers. The current treatment provides moderate improvement. Compliance problems include exercise and diet.       Review of Systems  Constitutional: Negative.  Negative for fever, chills, malaise/fatigue, diaphoresis and fatigue.  HENT: Negative.   Eyes: Negative.  Negative for blurred vision.  Respiratory: Negative.  Negative for cough, choking, chest tightness, shortness of breath and stridor.   Cardiovascular: Negative.  Negative for chest pain, palpitations, orthopnea, leg swelling and PND.  Gastrointestinal: Negative.  Negative for nausea, vomiting, abdominal pain, diarrhea, constipation and blood in stool.  Endocrine: Negative.   Genitourinary: Negative.  Negative for dysuria, urgency, frequency, hematuria, flank pain, decreased urine volume, discharge, penile swelling, scrotal swelling, enuresis, difficulty urinating, genital sores, penile pain and testicular pain.  Musculoskeletal: Negative.  Negative for back pain, arthralgias and neck pain.  Skin: Negative.  Negative for rash.  Allergic/Immunologic: Negative.   Neurological: Negative.  Negative for dizziness, tremors, syncope, light-headedness, numbness and headaches.  Hematological: Negative.   Psychiatric/Behavioral: Negative.        Objective:   Physical Exam  Constitutional: He is oriented to person, place, and time. He appears well-developed and well-nourished. No  distress.  HENT:  Head: Normocephalic and atraumatic.  Mouth/Throat: Oropharynx is clear and moist. No oropharyngeal exudate.  Eyes: Conjunctivae are normal. Right eye exhibits no discharge. Left eye exhibits no discharge. No scleral icterus.  Neck: Normal range of motion. Neck supple. No JVD present. No tracheal deviation present. No thyromegaly present.  Cardiovascular: Normal rate, regular rhythm, normal heart sounds and intact distal pulses.  Exam reveals no gallop and no friction rub.   No murmur heard. Pulmonary/Chest: Effort normal and breath sounds normal. No stridor. No respiratory distress. He has no wheezes. He has no rales. He exhibits no tenderness.  Abdominal: Soft. Bowel sounds are normal. He exhibits no distension and no mass. There is no tenderness. There is no rebound and no guarding. Hernia confirmed negative in the right inguinal area and confirmed negative in the left inguinal area.  Genitourinary: Testes normal and penis normal. Rectal exam shows no external hemorrhoid, no internal hemorrhoid, no fissure, no mass, no tenderness and anal tone normal. Guaiac negative stool. Prostate is enlarged (rt lobe is boggy and tender) and tender. Right testis shows no mass, no swelling and no tenderness. Right testis is descended. Left testis shows no mass, no swelling and no tenderness. Left testis is descended. Uncircumcised. No phimosis, paraphimosis, hypospadias, penile erythema or penile tenderness. No discharge found.  Musculoskeletal: Normal range of motion. He exhibits no edema or tenderness.  Lymphadenopathy:    He has no cervical adenopathy.       Right: No inguinal adenopathy present.       Left: No inguinal adenopathy present.  Neurological: He is oriented to person, place, and time.  Skin: Skin is warm and dry. No rash noted. He is not diaphoretic. No erythema. No pallor.  Psychiatric: He has a normal mood and affect. His behavior is normal. Judgment and  thought content  normal.  Vitals reviewed.    Lab Results  Component Value Date   WBC 7.3 02/17/2015   HGB 11.8* 02/17/2015   HCT 36.1* 02/17/2015   PLT 195 02/17/2015   GLUCOSE 98 02/17/2015   CHOL 218* 11/28/2013   TRIG 186.0* 11/28/2013   HDL 43.60 11/28/2013   LDLDIRECT 165.2 11/28/2013   LDLCALC 112* 09/08/2011   ALT 62* 02/15/2015   AST 55* 02/15/2015   NA 141 02/17/2015   K 3.7 02/17/2015   CL 106 02/17/2015   CREATININE 1.05 02/17/2015   BUN 8 02/17/2015   CO2 25 02/17/2015   TSH 0.314* 02/14/2015   PSA 1.18 11/28/2013   INR 1.13 02/14/2015   HGBA1C 6.1* 02/14/2015       Assessment & Plan:

## 2015-03-07 NOTE — Assessment & Plan Note (Signed)
Will recheck his UA today, will restart cipro

## 2015-03-07 NOTE — Assessment & Plan Note (Signed)
His BP is well controlled Will recheck his TSh today Will monitor his lytes and renal function

## 2015-03-07 NOTE — Assessment & Plan Note (Signed)
He is s/p a recent admission for urosepsis from E coli His prostate exam is still abnormal and I am concerned that he may have a recurrence if not treated for a longer period of time Will extend cipro for another month

## 2015-03-07 NOTE — Progress Notes (Signed)
Pre visit review using our clinic review tool, if applicable. No additional management support is needed unless otherwise documented below in the visit note. 

## 2015-03-07 NOTE — Patient Instructions (Signed)

## 2015-06-12 ENCOUNTER — Encounter: Payer: Self-pay | Admitting: Gastroenterology

## 2015-07-20 IMAGING — CR DG CHEST 1V PORT
1 series · 1 of 1 positions shown · non-contrast
Comparison: 02/14/2015

CLINICAL DATA: Sepsis

EXAM:
PORTABLE CHEST - 1 VIEW

[AP]
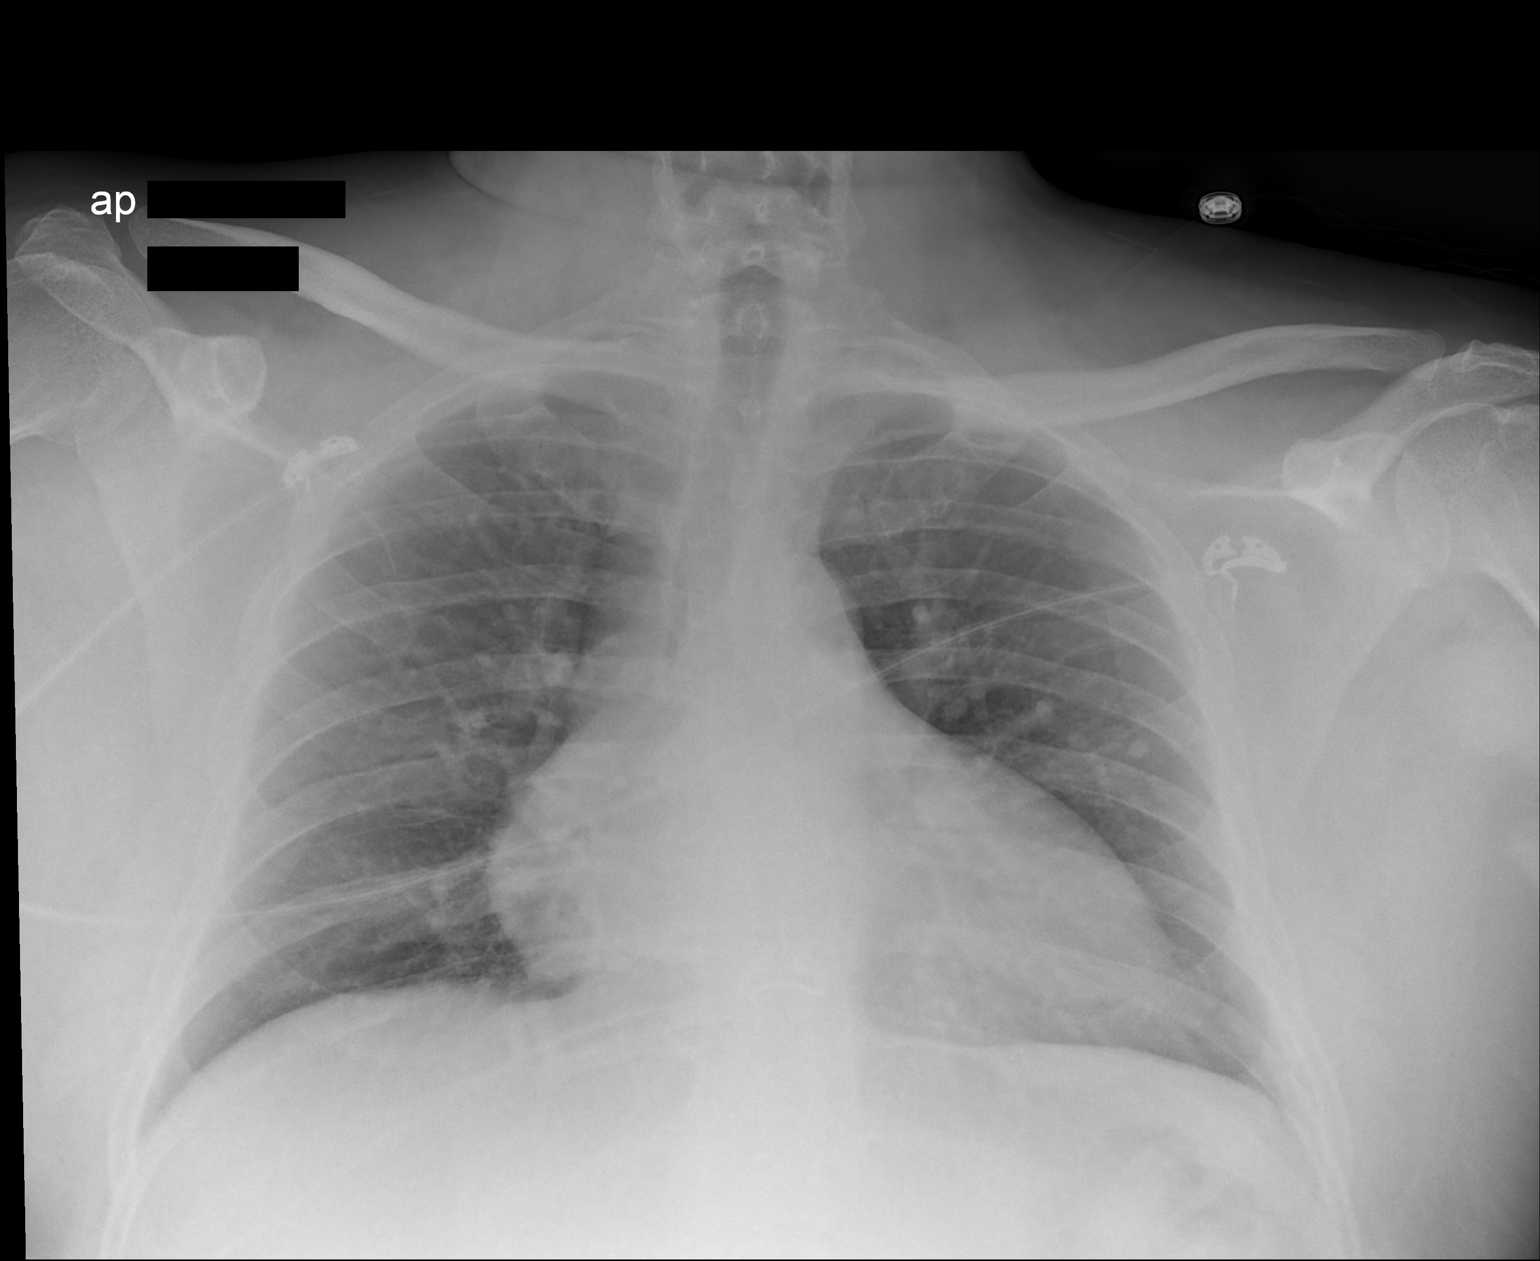

[1 of 1 positions shown; findings below may reference images not displayed]

FINDINGS: Cardiac shadow remains enlarged. No focal infiltrate or sizable
effusion is seen. No bony abnormality is noted.
IMPRESSION: Stable cardiomegaly.  No acute abnormality is noted.

## 2016-02-06 ENCOUNTER — Ambulatory Visit: Payer: 59 | Admitting: Internal Medicine

## 2016-02-27 ENCOUNTER — Other Ambulatory Visit: Payer: Self-pay | Admitting: Internal Medicine

## 2016-02-28 ENCOUNTER — Other Ambulatory Visit: Payer: Self-pay | Admitting: Internal Medicine

## 2018-01-23 ENCOUNTER — Emergency Department (INDEPENDENT_AMBULATORY_CARE_PROVIDER_SITE_OTHER)
Admission: EM | Admit: 2018-01-23 | Discharge: 2018-01-23 | Disposition: A | Discharge status: Home / Self Care | Payer: BLUE CROSS/BLUE SHIELD | Source: Home / Self Care | Attending: Family Medicine | Admitting: Family Medicine

## 2018-01-23 ENCOUNTER — Encounter: Payer: Self-pay | Admitting: Emergency Medicine

## 2018-01-23 DIAGNOSIS — J069 Acute upper respiratory infection, unspecified: Secondary | ICD-10-CM | POA: Diagnosis not present

## 2018-01-23 DIAGNOSIS — J019 Acute sinusitis, unspecified: Secondary | ICD-10-CM

## 2018-01-23 MED ORDER — AMOXICILLIN 500 MG PO CAPS: 500.0000 mg | ORAL_CAPSULE | Freq: Three times a day (TID) | ORAL | 0 refills | Status: AC

## 2018-01-23 MED ORDER — BENZONATATE 100 MG PO CAPS
100.0000 mg | ORAL_CAPSULE | Freq: Three times a day (TID) | ORAL | 0 refills | Status: AC
Start: 2018-01-23 — End: ?

## 2018-01-23 NOTE — Discharge Instructions (Signed)
°  You may take 500mg acetaminophen every 4-6 hours or in combination with ibuprofen 400-600mg every 6-8 hours as needed for pain, inflammation, and fever. ° °Be sure to drink at least eight 8oz glasses of water to stay well hydrated and get at least 8 hours of sleep at night, preferably more while sick.  ° °Please take antibiotics as prescribed and be sure to complete entire course even if you start to feel better to ensure infection does not come back. ° °

## 2018-01-23 NOTE — ED Triage Notes (Signed)
Patient complaining of head congestion, sneezing, non-productive cough x 1 week, runny nose and HA.  Tried Alka Seltzer and Aleve without relief.

## 2018-01-23 NOTE — ED Provider Notes (Signed)
[facility]    CSN: [id number] Arrival date & time: 01/23/18  1135     History   Chief Complaint Chief Complaint  Patient presents with  . Cough    HPI Alexander Moses is a 57 y.o. male.   HPI Alexander Moses is a 57 y.o. male presenting to UC with c/o 1 week of cough, congestion, sneezing, rhinorrhea and HA that has waxed and waned but now worsening.  No relief with Alka Seltzer or Aleve. Denies fever, chills, n/v/d. His wife was sick about 2-3 weeks ago.     Past Medical History:  Diagnosis Date  . GERD (gastroesophageal reflux disease)   . Hyperlipidemia   . Hypertension     Patient Active Problem List   Diagnosis Date Noted  . Prostatitis, acute 03/07/2015  . UTI (lower urinary tract infection) 02/14/2015  . Biceps tendonitis on right 09/21/2014  . 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 CTS (carpal tunnel syndrome) 07/05/2013  . Cervical radiculitis 08/05/2012  . ESOPHAGEAL STRICTURE 11/17/2010  . GERD 11/01/2009  . Pre-diabetes 12/19/2008  . Essential hypertension 11/12/2008  . HYPERCHOLESTEROLEMIA, PURE 11/09/2008  . ERECTILE DYSFUNCTION 11/09/2008    Past Surgical History:  Procedure Laterality Date  . REPLACEMENT TOTAL KNEE    . VARICOCELECTOMY         Home Medications    Prior to Admission medications   Medication Sig Start Date End Date Taking? Authorizing Provider  amLODipine-olmesartan (AZOR) 5-20 MG tablet Take 1 tablet by mouth daily. Overdue for yearly physical w/labs must see md for refills 02/27/16   Etta Grandchild, MD  amoxicillin (AMOXIL) 500 MG capsule Take 1 capsule (500 mg total) by mouth 3 (three) times daily. 01/23/18   Lurene Shadow, PA-C  benzonatate (TESSALON) 100 MG capsule Take 1-2 capsules (100-200 mg total) by mouth every 8 (eight) hours. 01/23/18   Lurene Shadow, PA-C  losartan (COZAAR) 100 MG tablet Take 1 tablet (100 mg total) by mouth daily. 08/10/14   Etta Grandchild, MD    Family History Family History  Problem  Relation Age of Onset  . Hypertension Mother   . Asthma Mother   . Diabetes Unknown        FAM HX 1st degree relative  . Hyperlipidemia Unknown        FAM HX  . Hypertension Unknown        FAM HX  . Cancer Neg Hx   . Early death Neg Hx   . Hearing loss Neg Hx   . Heart disease Neg Hx   . Kidney disease Neg Hx   . Stroke Neg Hx     Social History Social History   Tobacco Use  . Smoking status: Former Smoker    Last attempt to quit: 07/02/1986    Years since quitting: 31.5  . Smokeless tobacco: Never Used  Substance Use Topics  . Alcohol use: No  . Drug use: No     Allergies   Patient has no known allergies.   Review of Systems Review of Systems  Constitutional: Negative for chills and fever.  HENT: Positive for congestion, rhinorrhea, sinus pressure and sneezing. Negative for ear pain, sore throat, trouble swallowing and voice change.   Respiratory: Positive for cough. Negative for shortness of breath.   Cardiovascular: Negative for chest pain and palpitations.  Gastrointestinal: Negative for abdominal pain, diarrhea, nausea and vomiting.  Musculoskeletal: Negative for arthralgias, back pain and myalgias.  Skin: Negative for rash.  Neurological: Positive for headaches. Negative for dizziness and light-headedness.     Physical Exam Triage Vital Signs ED Triage Vitals [01/23/18 1155]  Enc Vitals Group     BP (!) 144/85     Pulse Rate 78     Resp      Temp 99.2 F (37.3 C)     Temp Source Oral     SpO2 98 %     Weight 263 lb 4 oz (119.4 kg)     Height 5\' 8"  (1.727 m)     Head Circumference      Peak Flow      Pain Score 0     Pain Loc      Pain Edu?      Excl. in GC?    No data found.  Updated Vital Signs BP (!) 144/85 (BP Location: Right Arm)   Pulse 78   Temp 99.2 F (37.3 C) (Oral)   Ht 5\' 8"  (1.727 m)   Wt 263 lb 4 oz (119.4 kg)   SpO2 98%   BMI 40.03 kg/m   Visual Acuity Right Eye Distance:   Left Eye Distance:   Bilateral Distance:     Right Eye Near:   Left Eye Near:    Bilateral Near:     Physical Exam  Constitutional: He is oriented to person, place, and time. He appears well-developed and well-nourished. No distress.  HENT:  Head: Normocephalic and atraumatic.  Right Ear: Tympanic membrane normal.  Left Ear: Tympanic membrane normal.  Nose: Mucosal edema present. Right sinus exhibits maxillary sinus tenderness and frontal sinus tenderness. Left sinus exhibits maxillary sinus tenderness and frontal sinus tenderness.  Mouth/Throat: Uvula is midline, oropharynx is clear and moist and mucous membranes are normal.  Eyes: EOM are normal.  Neck: Normal range of motion. Neck supple.  Cardiovascular: Normal rate and regular rhythm.  Pulmonary/Chest: Effort normal and breath sounds normal. No stridor. No respiratory distress. He has no wheezes. He has no rales.  Musculoskeletal: Normal range of motion.  Lymphadenopathy:    He has no cervical adenopathy.  Neurological: He is alert and oriented to person, place, and time.  Skin: Skin is warm and dry. He is not diaphoretic.  Psychiatric: He has a normal mood and affect. His behavior is normal.  Nursing note and vitals reviewed.    UC Treatments / Results  Labs (all labs ordered are listed, but only abnormal results are displayed) Labs Reviewed - No data to display  EKG  EKG Interpretation None       Radiology No results found.  Procedures Procedures (including critical care time)  Medications Ordered in UC Medications - No data to display   Initial Impression / Assessment and Plan / UC Course  I have reviewed the triage vital signs and the nursing notes.  Pertinent labs & imaging results that were available during my care of the patient were reviewed by me and considered in my medical decision making (see chart for details).     Will tx for sinusitis Encouraged to take medications as prescribed F/u with PCP in 1 week as needed.   Final Clinical  Impressions(s) / UC Diagnoses   Final diagnoses:  Upper respiratory tract infection, unspecified type  Acute rhinosinusitis    ED Discharge Orders        Ordered    amoxicillin (AMOXIL) 500 MG capsule  3 times daily     01/23/18 1230    benzonatate (TESSALON) 100 MG capsule  Every 8 hours     01/23/18 1230       Controlled Substance Prescriptions [place] Controlled Substance Registry consulted? Not Applicable   Rolla Platehelps, Elah Avellino O, PA-C 01/23/18 1409
